# Patient Record
Sex: Male | Born: 1960 | Race: Black or African American | Hispanic: No | Marital: Single | Smoking: Current some day smoker
Health system: Southern US, Community
[De-identification: ages and names within clinical notes are randomized; demographics above are authoritative.]

## PROBLEM LIST (undated history)

## (undated) DIAGNOSIS — B20 Human immunodeficiency virus [HIV] disease: Secondary | ICD-10-CM

## (undated) DIAGNOSIS — A549 Gonococcal infection, unspecified: Secondary | ICD-10-CM

## (undated) DIAGNOSIS — Z21 Asymptomatic human immunodeficiency virus [HIV] infection status: Secondary | ICD-10-CM

## (undated) HISTORY — PX: CHOLECYSTECTOMY: SHX55

---

## 2002-07-15 ENCOUNTER — Inpatient Hospital Stay (HOSPITAL_COMMUNITY): Admission: EM | Admit: 2002-07-15 | Discharge: 2002-07-17 | Payer: Self-pay | Admitting: Emergency Medicine

## 2002-07-15 ENCOUNTER — Encounter: Payer: Self-pay | Admitting: Internal Medicine

## 2002-08-21 ENCOUNTER — Encounter: Admission: RE | Admit: 2002-08-21 | Discharge: 2002-08-21 | Payer: Self-pay | Admitting: Internal Medicine

## 2002-08-28 ENCOUNTER — Encounter: Admission: RE | Admit: 2002-08-28 | Discharge: 2002-08-28 | Payer: Self-pay | Admitting: Internal Medicine

## 2002-10-23 ENCOUNTER — Encounter: Admission: RE | Admit: 2002-10-23 | Discharge: 2002-10-23 | Payer: Self-pay | Admitting: Internal Medicine

## 2002-10-31 ENCOUNTER — Encounter: Admission: RE | Admit: 2002-10-31 | Discharge: 2002-10-31 | Payer: Self-pay | Admitting: Internal Medicine

## 2002-11-07 ENCOUNTER — Encounter: Admission: RE | Admit: 2002-11-07 | Discharge: 2002-11-07 | Payer: Self-pay | Admitting: Internal Medicine

## 2002-11-17 ENCOUNTER — Encounter: Admission: RE | Admit: 2002-11-17 | Discharge: 2002-11-17 | Payer: Self-pay | Admitting: Internal Medicine

## 2003-08-04 ENCOUNTER — Emergency Department (HOSPITAL_COMMUNITY): Admission: EM | Admit: 2003-08-04 | Discharge: 2003-08-04 | Payer: Self-pay | Admitting: Emergency Medicine

## 2003-08-08 ENCOUNTER — Encounter: Admission: RE | Admit: 2003-08-08 | Discharge: 2003-08-08 | Payer: Self-pay | Admitting: Internal Medicine

## 2003-08-10 ENCOUNTER — Emergency Department (HOSPITAL_COMMUNITY): Admission: EM | Admit: 2003-08-10 | Discharge: 2003-08-10 | Payer: Self-pay

## 2003-08-15 ENCOUNTER — Encounter: Admission: RE | Admit: 2003-08-15 | Discharge: 2003-08-15 | Payer: Self-pay | Admitting: Internal Medicine

## 2003-09-12 ENCOUNTER — Encounter: Admission: RE | Admit: 2003-09-12 | Discharge: 2003-09-12 | Payer: Self-pay | Admitting: Internal Medicine

## 2003-09-21 ENCOUNTER — Ambulatory Visit (HOSPITAL_COMMUNITY): Admission: RE | Admit: 2003-09-21 | Discharge: 2003-09-21 | Payer: Self-pay | Admitting: Gastroenterology

## 2003-10-19 ENCOUNTER — Encounter: Admission: RE | Admit: 2003-10-19 | Discharge: 2003-10-19 | Payer: Self-pay | Admitting: Gastroenterology

## 2003-12-17 ENCOUNTER — Encounter (INDEPENDENT_AMBULATORY_CARE_PROVIDER_SITE_OTHER): Payer: Self-pay | Admitting: *Deleted

## 2003-12-17 ENCOUNTER — Ambulatory Visit (HOSPITAL_COMMUNITY): Admission: RE | Admit: 2003-12-17 | Discharge: 2003-12-18 | Payer: Self-pay | Admitting: Surgery

## 2008-05-03 ENCOUNTER — Emergency Department (HOSPITAL_COMMUNITY): Admission: EM | Admit: 2008-05-03 | Discharge: 2008-05-03 | Payer: Self-pay | Admitting: Emergency Medicine

## 2008-08-15 ENCOUNTER — Emergency Department (HOSPITAL_COMMUNITY): Admission: EM | Admit: 2008-08-15 | Discharge: 2008-08-15 | Payer: Self-pay | Admitting: Emergency Medicine

## 2008-10-17 ENCOUNTER — Emergency Department (HOSPITAL_COMMUNITY): Admission: EM | Admit: 2008-10-17 | Discharge: 2008-10-17 | Payer: Self-pay | Admitting: Emergency Medicine

## 2008-10-18 ENCOUNTER — Emergency Department (HOSPITAL_COMMUNITY): Admission: EM | Admit: 2008-10-18 | Discharge: 2008-10-18 | Payer: Self-pay | Admitting: Emergency Medicine

## 2008-10-23 ENCOUNTER — Inpatient Hospital Stay (HOSPITAL_COMMUNITY): Admission: EM | Admit: 2008-10-23 | Discharge: 2008-10-26 | Payer: Self-pay | Admitting: Gastroenterology

## 2008-11-20 ENCOUNTER — Emergency Department (HOSPITAL_COMMUNITY): Admission: EM | Admit: 2008-11-20 | Discharge: 2008-11-20 | Payer: Self-pay | Admitting: Family Medicine

## 2010-01-19 IMAGING — CT CT PELVIS W/ CM
2 of 5 series · 17 of 46 positions shown, 19 images · IV contrast (agent unspecified)
Comparison: No priors

CT ABDOMEN

CLINICAL DATA: Lower abdominal pain

CT ABDOMEN AND PELVIS WITH CONTRAST
TECHNIQUE: Multidetector CT imaging of the abdomen and pelvis was
performed using the standard protocol following bolus
administration of intravenous contrast.
Contrast: 100 ml Rmnipaque-VPP

[Series 4: abd/pelv with 5.0 b31f st · axial · 0.66mm/px · z∈[-427,+13]mm · 14 of 98 slices shown, 16 images]
[im 5/98  soft-tissue]
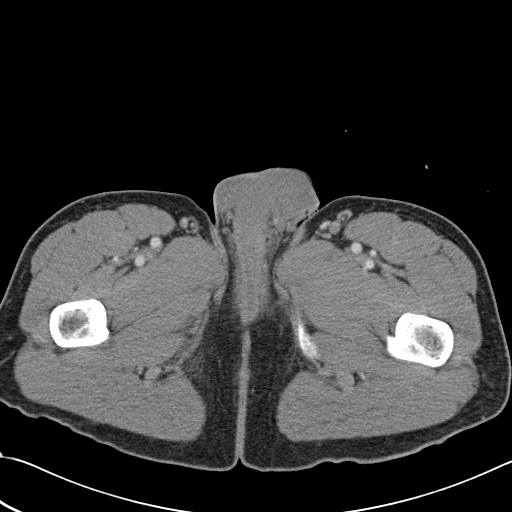
[im 5/98  bone]
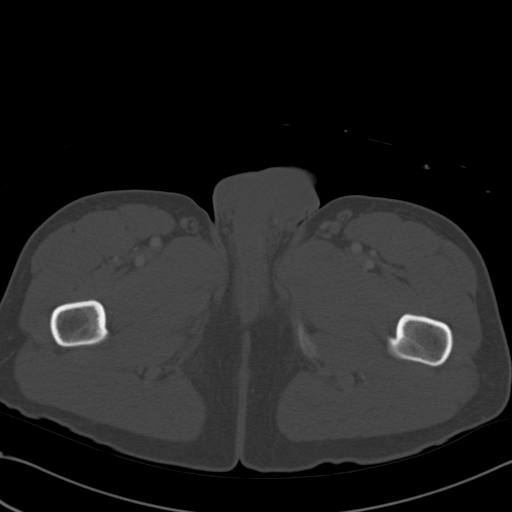
[im 15/98  soft-tissue]
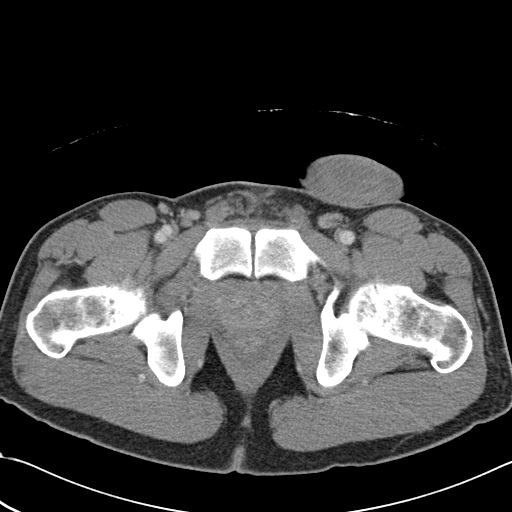
[im 20/98  soft-tissue]
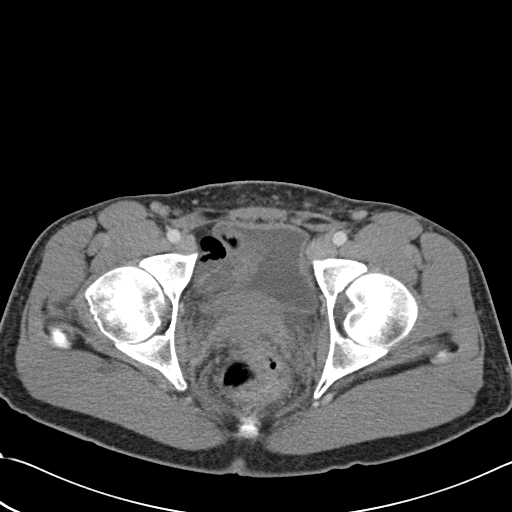
[im 25/98  soft-tissue]
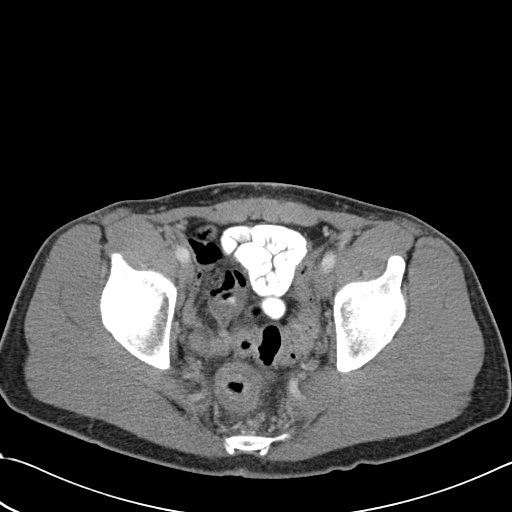
[im 34/98  soft-tissue]
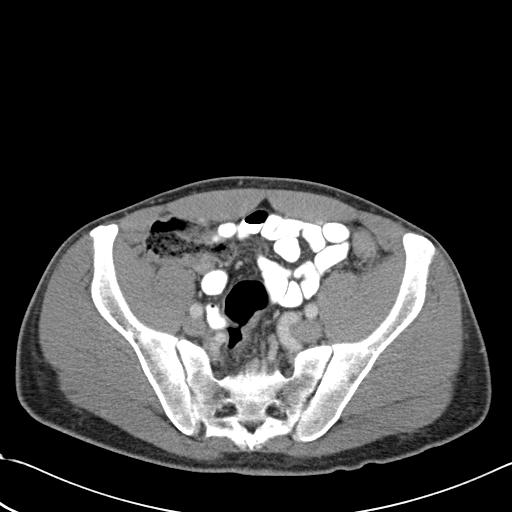
[im 39/98  soft-tissue]
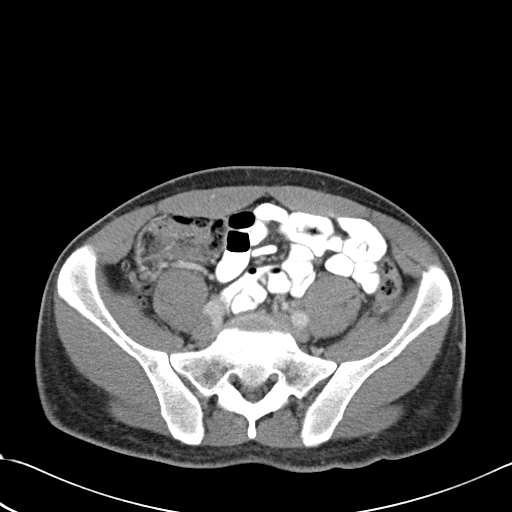
[im 44/98  soft-tissue]
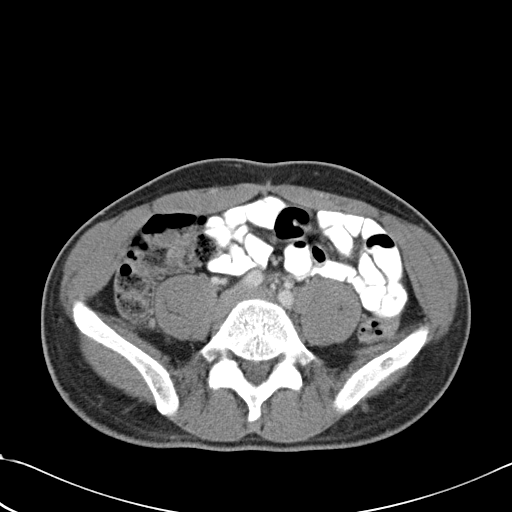
[im 54/98  soft-tissue]
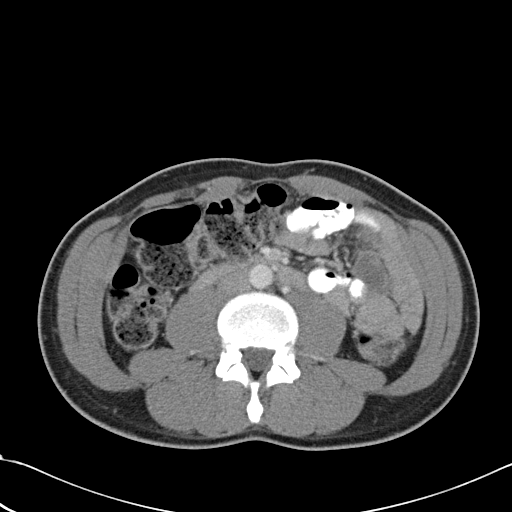
[im 59/98  soft-tissue]
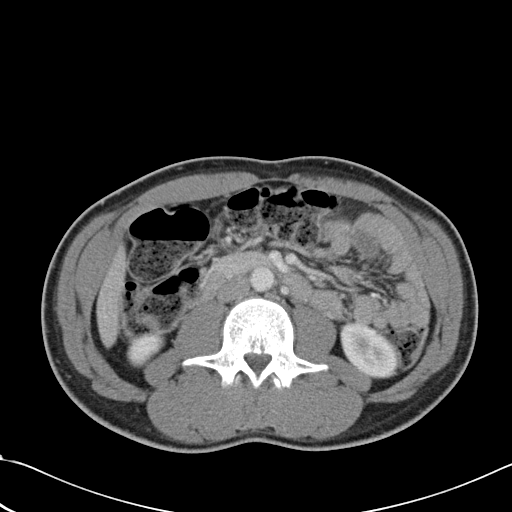
[im 59/98  bone]
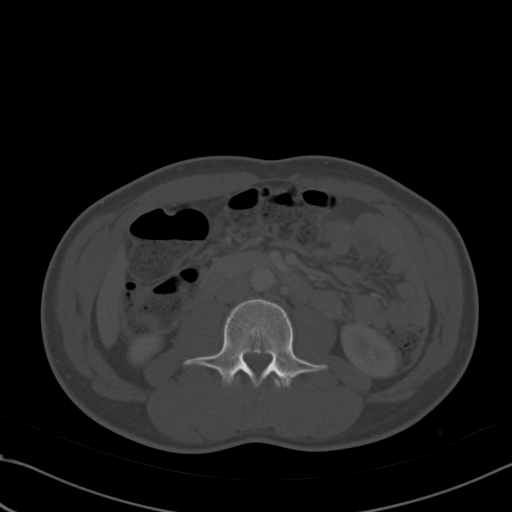
[im 64/98  soft-tissue]
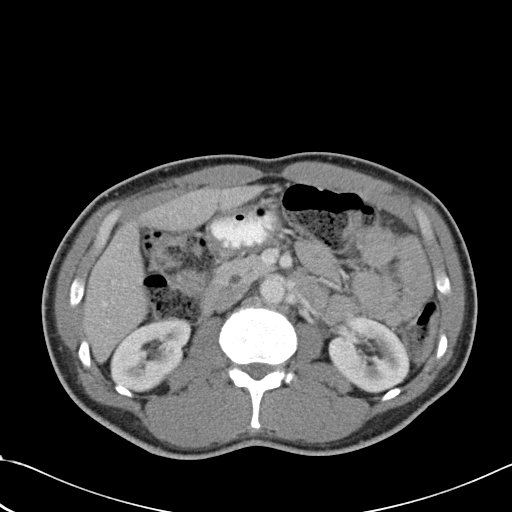
[im 73/98  soft-tissue]
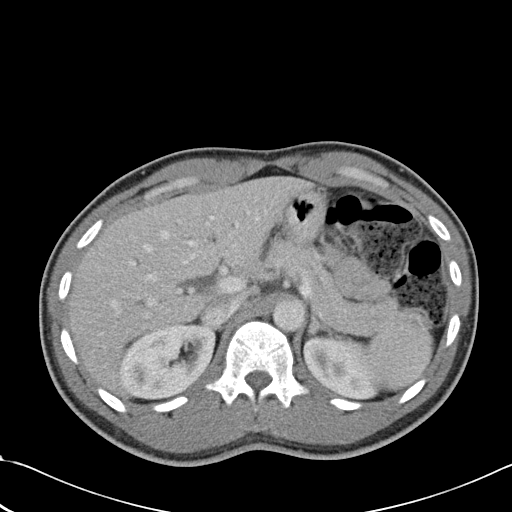
[im 78/98  soft-tissue]
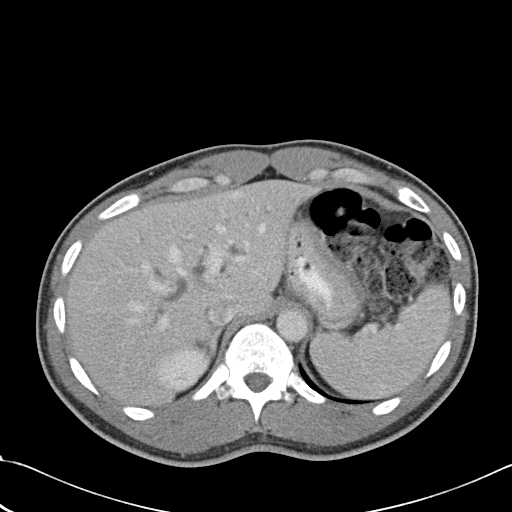
[im 83/98  soft-tissue]
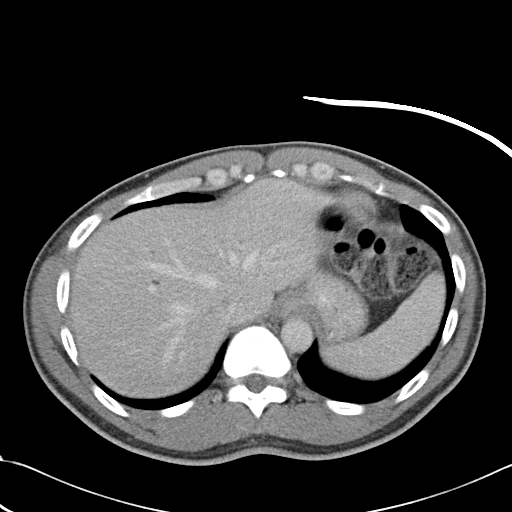
[im 93/98  soft-tissue]
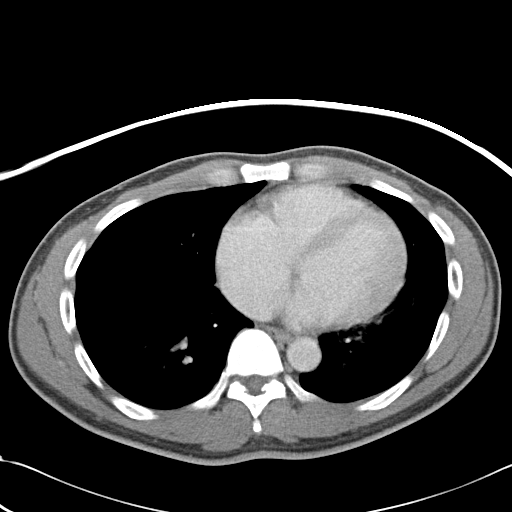

[Series 602: cor abd/pel · coronal · 0.95mm/px · 3 of 92 slices shown]
[im 31/92  soft-tissue]
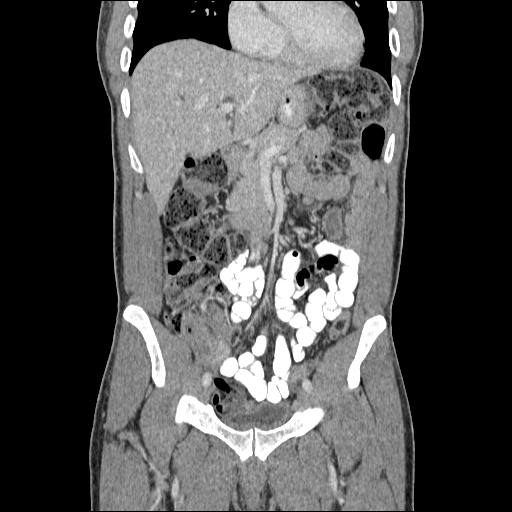
[im 41/92  soft-tissue]
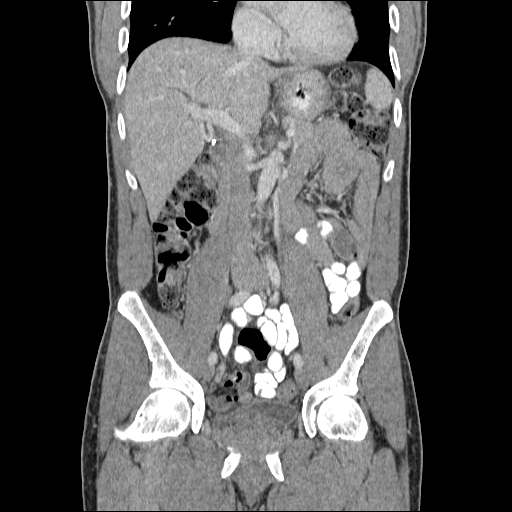
[im 51/92  soft-tissue]
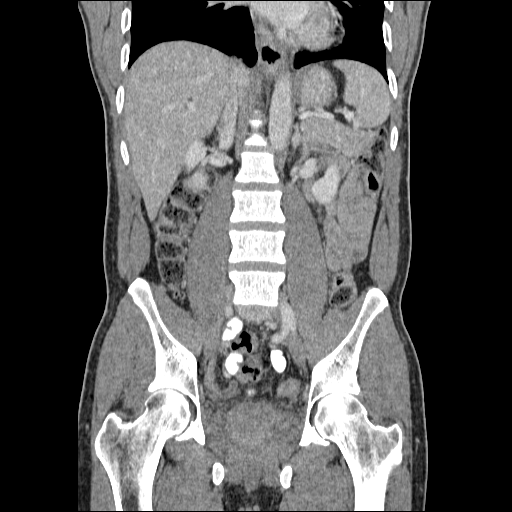

[17 of 46 positions shown; findings below may reference images not displayed]

FINDINGS: The lung bases are clear.  Liver, spleen, pancreas, and
adrenals unremarkable.  Early and delayed images of the kidneys
normal except for a small simple cyst of the right kidney.

There is moderate intra and extrahepatic biliary dilatation.  This
can be seen normally post cholecystectomy, although the
intrahepatic ducts are generally not as dilated as noted on this
exam.  Cannot exclude biliary obstruction.  This needs clinical
correlation.

No adenopathy or ascites.  Generalized increased stool throughout
the colon without inflammatory changes of the bowel.
IMPRESSION: 1.  Normal except for intra and extrahepatic biliary dilatation -
this may be due to prior cholecystectomy, but needs clinical
correlation.  See report.

CT PELVIS
FINDINGS: No focal masses, adenopathy, or fluid collections.  The
appendix is normal.  No changes of diverticulitis or other focal
abnormality of the GI tract.  There is a moderate increase in stool
noted.
IMPRESSION: 1.  No acute or specific findings.
2.  Relative increased stool throughout the colon.

## 2010-05-11 ENCOUNTER — Emergency Department (HOSPITAL_COMMUNITY): Admission: EM | Admit: 2010-05-11 | Discharge: 2010-05-11 | Payer: Self-pay | Admitting: Emergency Medicine

## 2010-12-27 ENCOUNTER — Encounter: Payer: Self-pay | Admitting: Gastroenterology

## 2011-04-21 NOTE — Discharge Summary (Signed)
[name], Trevor Collins NO.:  [id number]   MEDICAL RECORD NO.:  [id number]          PATIENT TYPE:  INP   LOCATION:  3029                         FACILITY:  [facility]   PHYSICIAN:  Jordan Hawks. Elnoria Howard, MD    DATE OF BIRTH:  [birth year]   DATE OF ADMISSION:  10/23/2008  DATE OF DISCHARGE:  10/26/2008                               DISCHARGE SUMMARY   DISCHARGE DIAGNOSIS:  Acute idiopathic constipation.   HOSPITAL COURSE:  Please see the original H and P for full details.  The  patient was admitted with acute constipation.  He apparently had been  suffering with this constipation for approximately 2 weeks and despite  over-the-counter medications, he did not receive any benefit.  The  patient was evaluated in the office and instructed to take 1 bottle of  MiraLax in order to cause a significant cathartic effect and  unfortunately, the patient did not experience any type of effect and he  felt that his abdomen was tight and there is abdominal pain and an  associated nausea.  Because of his symptoms, there was concern for the  possibility of obstruction.  He was subsequently admitted to the  hospital for further evaluation and treatment.  A KUB was performed and  was negative for any evidence of an obstruction.  He was provided with  multiple Fleet enemas and tap water enemas, however, this did not  provide any significant effect.  On the day before he was discharged, he  was started on GoLYTELY 4 L as well as bisacodyl 20 mg and erythromycin  250 mg q.8 h.  Subsequently, the patient was able to be cleared out and  he felt back to his baseline before the acute onset of constipation.  Because the patient was back to his baseline, it was felt that he could  be safely discharged.   Plan at this time is for the patient:  1. To maintain on MiraLax 3 capfuls per day.  2. To maintain Amitiza 24 mcg b.i.d.  3. To follow up with a colonoscopy on October 30, 2008, as previously  scheduled to evaluate a heme-positive stool and hematochezia, and      he can call Dr. Elnoria Howard with any issues or questions.      Jordan Hawks Elnoria Howard, MD  Electronically Signed     PDH/MEDQ  D:  10/26/2008  T:  10/26/2008  Job:  [id number]

## 2011-04-21 NOTE — H&P (Signed)
NAMEMAVERYCK, Trevor Collins NO.:  [id number]   MEDICAL RECORD NO.:  [id number]          PATIENT TYPE:  INP   LOCATION:  3029                         FACILITY:  [facility]   PHYSICIAN:  Jordan Hawks. Elnoria Howard, MD    DATE OF BIRTH:  05/20/1961   DATE OF ADMISSION:  10/23/2008  DATE OF DISCHARGE:                              HISTORY & PHYSICAL   REASON FOR ADMISSION:  Constipation and abdominal pain.   HISTORY OF PRESENT ILLNESS:  This is a 50 year old gentleman who has a  past medical history of biliary dyskinesia, status post cholecystectomy  in 2005, who was originally seen in the office 24 hours ago with  complaints of constipation and the patient was evaluated in the urgent  care setting and also at [facility] Emergency Room for acute onset of  constipation.  The patient previously had routine bowel movements and he  typically had bowel movements after p.o. intake.  However, for unclear  causes, the patient has developed acute constipation.  In the emergency  room, a CT scan was performed and it was negative for any evidence of an  obstruction or any masses, although with rectal examination in the  office, the patient was noted to be heme positive.  The patient tried  over-the-counter laxatives such as magnesium citrate as well as Fleet  Enema without any significant efficacy.  He currently does report having  some flatus, but no bowel movements.  Since his office visit, he was  recommended to start on MiraLax 255 g to be mixed with 32 ounces of  water.  He did this treatment as instructed and unfortunately he has had  no bowel movements as a result.  The patient called today, complaining  about having no results and requested further options.  A Fleet Enema x2  was recommended and unfortunately this did not provide any benefits and  subsequently, he was provided with samples of Amitiza 24 mcg.  This also  did not provide any benefit and subsequently, it was felt that he should  be admitted to the hospital for further treatment because of his  abdominal discomfort and nausea.   PAST MEDICAL HISTORY AND PAST SURGICAL HISTORY:  As stated above, with  the addition of a prior infection of hepatitis B.   FAMILY HISTORY:  Noncontributory.   SOCIAL HISTORY:  Negative for illicit drug use and alcohol.  Positive  for tobacco 1/2 pack per day.   REVIEW OF SYSTEMS:  As stated above in the history of present illness,  otherwise negative.   PHYSICAL EXAMINATION:  VITAL SIGNS:  Pending at this time.  GENERAL:  The patient is in no acute distress, but he does appear to be  very uncomfortable.  HEENT:  Normocephalic and atraumatic.  Extraocular muscles are intact.  NECK:  Supple.  No lymphadenopathy.  LUNGS:  Clear to auscultation bilaterally.  CARDIOVASCULAR:  Regular rate and rhythm.  ABDOMEN:  Flat and soft.  Positive bowel sounds.  No significant  tenderness.  No rebound or rigidity.  EXTREMITIES:  No clubbing, cyanosis, or edema.   LABORATORY DATA:  Laboratory values are pending at this time.   IMPRESSION:  1. Acute constipation of unknown etiology.  2. Abdominal discomfort secondary to the constipation.  At this time,      I am uncertain why the patient has such significant constipation.      I did review the CT scan and his prior KUB and it was essentially      unrevealing for any etiology.  There was stool, but it did not      appear to be more than anticipated.  The original plan was for him      to undergo a colonoscopy to further evaluate this heme positive      stool and in anticipation that he was to respond to MiraLax.      Unfortunately, there was no current response at this time.   PLAN:  1. Admit the patient.  2. Provide gentle tap water enemas in hopes of obtaining a cathodic      effect.  3. Order acute abdominal series to ensure that there is no evidence of      any obstruction.      Jordan Hawks Elnoria Howard, MD  Electronically Signed      PDH/MEDQ  D:  10/23/2008  T:  10/24/2008  Job:  [id number]

## 2011-04-24 NOTE — Op Note (Signed)
   NAME:  [name], Trevor Collins                         ACCOUNT NO.:  [id number]   MEDICAL RECORD NO.:  [id number]                   PATIENT TYPE:  INP   LOCATION:  3308                                 FACILITY:  [facility]   PHYSICIAN:  Charolett Bumpers, M.D.             DATE OF BIRTH:  05-26-1961   DATE OF PROCEDURE:  07/16/2002  DATE OF DISCHARGE:  07/17/2002                                 OPERATIVE REPORT   PROCEDURE PERFORMED:  Esophagogastroduodenoscopy.   ENDOSCOPIST:  Charolett Bumpers, M.D.   INDICATIONS FOR PROCEDURE:  [name] is a 50 year old male born 1961/04/27.  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   PREMEDICATION:  Versed 7.5 mg, fentanyl 50 mcg.   INSTRUMENT USED:  Olympus gastroscope.   DESCRIPTION OF PROCEDURE:  After obtaining informed consent, the patient was  placed in left lateral decubitus position.  I administered intravenous  Versed and intravenous fentanyl to achieve conscious sedation for this  procedure.  The patient's blood pressure, oxygen saturations and cardiac  rhythm were monitored throughout the procedure and documented in the medical  record.   The Olympus gastroscope was passed through the posterior hypopharynx into  the proximal esophagus without difficulty.  The hypopharynx, larynx and  vocal cords appeared normal.   Esophagoscopy:  The proximal, mid and lower segments of the esophagus appear  normal.  Gastroscopy:  Retroflex view of the gastric cardia and fundus was normal.  The gastric body, antrum and pylorus appeared normal.  Duodenoscopy:  The duodenal bulb, second portion of the duodenum and third  portion of the duodenum appeared normal.   ASSESSMENT:  Normal esophagogastroduodenoscopy.  No signs of upper  gastrointestinal bleeding.                                               Charolett Bumpers, M.D.    MKJ/MEDQ  D:  07/16/2002  T:  07/18/2002  Job:   [id number]   cc:   Alvester Morin, M.D.

## 2011-04-24 NOTE — Op Note (Signed)
   NAME:  Trevor Collins, Trevor Collins                         ACCOUNT NO.:  [id number]   MEDICAL RECORD NO.:  [id number]                   PATIENT TYPE:  AMB   LOCATION:  ENDO                                 FACILITY:  [facility]   PHYSICIAN:  Anselmo Rod, M.D.               DATE OF BIRTH:  09/02/1961   DATE OF PROCEDURE:  09/21/2003  DATE OF DISCHARGE:                                 OPERATIVE REPORT   PROCEDURE PERFORMED:  Esophagogastroduodenoscopy.   ENDOSCOPIST:  Anselmo Rod, M.D.   INSTRUMENT USED:  Olympus video panendoscope.   INDICATION FOR PROCEDURE:  Epigastric pain, chest discomfort, with rectal  bleeding in a 50 year old African-American male.  Rule out peptic ulcer  disease, esophagitis, gastritis, etc.   PREPROCEDURE PREPARATION:  Informed consent was procured from the patient.  The patient was fasted for eight hours prior to the procedure.   PREPROCEDURE PHYSICAL:  VITAL SIGNS:  The patient had stable vital signs.  NECK:  Supple.  CHEST:  Clear to auscultation.  S1, S2 regular.  ABDOMEN:  Soft with normal bowel sounds.   DESCRIPTION OF PROCEDURE:  The patient was placed in the left lateral  decubitus position and sedated with 50 mg of Demerol and 5 mg of Versed  intravenously.  Once the patient was adequately sedate and maintained on low-  flow oxygen and continuous cardiac monitoring, the Olympus video  panendoscope was advanced through the mouthpiece, over the tongue, into the  esophagus under direct vision.  The entire esophagus appeared normal with no  evidence of ring, stricture, masses, esophagitis, or Barrett's mucosa.  The  scope was then advanced into the stomach.  The entire gastric mucosa and the  proximal small bowel appeared normal.   IMPRESSION:  Normal esophagogastroduodenoscopy.    RECOMMENDATIONS:  1. Continue PPIs.  2. Avoid nonsteroidals, including aspirin.  3. Outpatient follow-up in the next two weeks for further recommendations.                                       Anselmo Rod, M.D.    JNM/MEDQ  D:  09/21/2003  T:  09/22/2003  Job:  [id number]   cc:   Dineen Kid. Reche Dixon, M.D.  [address]  [place] [address]  Fax: [phone]

## 2011-04-24 NOTE — Op Note (Signed)
NAME:  RAJVEER, HANDLER                         ACCOUNT NO.:  [id number]   MEDICAL RECORD NO.:  [id number]                   PATIENT TYPE:  OIB   LOCATION:  2899                                 FACILITY:  [facility]   PHYSICIAN:  Abigail Miyamoto, M.D.              DATE OF BIRTH:  04/25/1961   DATE OF PROCEDURE:  12/17/2003  DATE OF DISCHARGE:                                 OPERATIVE REPORT   PREOPERATIVE DIAGNOSIS:  Symptomatic cholelithiasis and biliary dyskinesia.   POSTOPERATIVE DIAGNOSIS:  Symptomatic cholelithiasis and biliary dyskinesia.   PROCEDURE:  Laparoscopic cholecystectomy with intraoperative cholangiogram.   SURGEON:  Abigail Miyamoto, M.D.   ANESTHESIA:  General endotracheal anesthesia with 0.25% Marcaine.   ESTIMATED BLOOD LOSS:  Minimal.   INDICATIONS FOR PROCEDURE:  The patient is a 50 year old gentleman with  hepatitis B who presented with abdominal pain.  He has had an ultrasound  showing gallbladder gallstones, sludge, and polyps.  He has had a follow-up  ultrasound which showed no stones.  He has had a Hiatus scan showing 53%  ejection fraction.  Given all of these findings, the decision was made to  proceed to the operating room for laparoscopic cholecystectomy and possible  liver biopsy.   FINDINGS:  The patient was found to have a normal liver, therefore biopsy  was deferred.  Cholangiogram was normal.   DESCRIPTION OF PROCEDURE:  The patient was brought to the operating room and  identified as Lilli Light.  He was placed supine on the operating table  and general anesthesia was induced.  His abdomen was then prepped and draped  in the usual sterile fashion.  Using a #15 blade, a small transverse  incision was made below the umbilicus.  The incision was then carried down  to the fascia which was then opened with the scalpel.  Hemostat was used to  pass through the peritoneal cavity.  Next, a 0 Vicryl pursestring suture was  placed around the fascial  opening.  The Hasson port was placed through the  opening and insufflation of the abdomen was begun.  A 12 mm port was then  placed in the patient's epigastrium.  Two 5 mm ports were placed in the  patient's right flank under direct vision.  The gallbladder was then grasped  and retracted above the liver bed.  Dissection was then carried out at the  base of the gallbladder.  The patient was found to have multiple adhesions  to the gallbladder which were taken down with the cautery.  The cystic duct  was then dissected out and clipped once distally.  It was then partly opened  with the laparoscopic scissors.  An Angiocatheter was then inserted in the  right upper quadrant under direct vision.  The Cholangiocatheter was passed  through the Angiocatheter and placed into the opening in the cystic duct.  The cholangiogram was then performed under direct fluoroscopy.  Good flow of  contrast was seen into the entire biliary system and duodenum without  evidence of abnormality.  At this point, the Cholangiocatheter was removed.  The cystic duct was then clipped three times proximally and transected with  scissors.  The cystic artery was then identified, clipped twice proximally  and once distally, and transected as well.  A second posterior vessel was  clipped once proximally and transected with the electrocautery.  The  gallbladder was then slowly dissected free from the liver bed with the  electrocautery.  Once it was free from the liver bed, the liver bed was  examined and hemostasis was achieved with the cautery.  The gallbladder was  then removed through the incision at the umbilicus and sent to pathology for  identification.  The 0 Vicryl at the umbilicus was then tied in place  closing the fascial defect.  The liver bed was again examined and hemostasis  felt to be achieved.  The abdomen was then irrigated with normal saline.  All ports were then removed under direct vision and the abdomen  was  deflated.  All incisions were anesthetized with 0.25% Marcaine and then  closed with 4-0 Monocryl subcuticular sutures.  Steri-Strips, gauze, and  tape were then applied.  The patient tolerated the procedure well.  All  needle, sponge, and instrument counts correct at the end of the procedure.  The patient was then extubated in the operating room and taken in stable  condition to the recovery room.                                               Abigail Miyamoto, M.D.    DB/MEDQ  D:  12/17/2003  T:  12/17/2003  Job:  [id number]

## 2011-05-22 ENCOUNTER — Emergency Department (HOSPITAL_COMMUNITY)
Admission: EM | Admit: 2011-05-22 | Discharge: 2011-05-22 | Disposition: A | Discharge status: Home / Self Care | Payer: 59 | Attending: Emergency Medicine | Admitting: Emergency Medicine

## 2011-05-22 DIAGNOSIS — L0211 Cutaneous abscess of neck: Secondary | ICD-10-CM | POA: Insufficient documentation

## 2011-05-22 DIAGNOSIS — L03221 Cellulitis of neck: Secondary | ICD-10-CM | POA: Insufficient documentation

## 2011-09-02 LAB — URINE CULTURE: Colony Count: 2000

## 2011-09-02 LAB — COMPREHENSIVE METABOLIC PANEL
ALT: 14
AST: 18
Albumin: 3.8
Alkaline Phosphatase: 71
BUN: 5 — ABNORMAL LOW
CO2: 27
Calcium: 8.6
Chloride: 106
Creatinine, Ser: 1.1
GFR calc Af Amer: >60
GFR calc non Af Amer: >60
Glucose, Bld: 96
Potassium: 3.6
Sodium: 140
Total Bilirubin: 0.7
Total Protein: 6.6

## 2011-09-02 LAB — URINALYSIS, ROUTINE W REFLEX MICROSCOPIC
Bilirubin Urine: NEGATIVE
Glucose, UA: NEGATIVE
Hgb urine dipstick: NEGATIVE
Ketones, ur: NEGATIVE
Nitrite: NEGATIVE
Protein, ur: NEGATIVE
Specific Gravity, Urine: 1.017
Urobilinogen, UA: 0.2
pH: 7

## 2011-09-02 LAB — CBC
HCT: 45.8
Hemoglobin: 15.4
MCHC: 33.7
MCV: 81.1
Platelets: 209
RBC: 5.65
RDW: 13.9
WBC: 6.1

## 2011-09-02 LAB — DIFFERENTIAL
Basophils Absolute: 0
Basophils Relative: 0
Eosinophils Absolute: 0.2
Eosinophils Relative: 3
Lymphocytes Relative: 18
Lymphs Abs: 1.1
Monocytes Absolute: 0.2
Monocytes Relative: 3
Neutro Abs: 4.6
Neutrophils Relative %: 75

## 2011-09-02 LAB — URINE MICROSCOPIC-ADD ON

## 2011-09-02 LAB — LIPASE, BLOOD: Lipase: 15

## 2011-09-02 LAB — OCCULT BLOOD X 1 CARD TO LAB, STOOL: Fecal Occult Bld: NEGATIVE

## 2011-09-08 LAB — COMPREHENSIVE METABOLIC PANEL
ALT: 16
AST: 35
Albumin: 3.7
Alkaline Phosphatase: 111
BUN: 5 — ABNORMAL LOW
CO2: 28
Calcium: 8.8
Chloride: 105
Creatinine, Ser: 1.01
GFR calc Af Amer: >60
GFR calc non Af Amer: >60
Glucose, Bld: 86
Potassium: 4
Sodium: 140
Total Bilirubin: 1.1
Total Protein: 6.6

## 2011-09-08 LAB — DIFFERENTIAL
Basophils Absolute: 0
Basophils Relative: 1
Eosinophils Absolute: 0.2
Eosinophils Relative: 3
Lymphocytes Relative: 34
Lymphs Abs: 1.8
Monocytes Absolute: 0.4
Monocytes Relative: 8
Neutro Abs: 2.9
Neutrophils Relative %: 54

## 2011-09-08 LAB — CBC
HCT: 43.7
HCT: 43.8
HCT: 48.5
Hemoglobin: 14.3
Hemoglobin: 14.3
Hemoglobin: 15.7
MCHC: 32.4
MCHC: 32.7
MCHC: 32.8
MCV: 81.4
MCV: 81.8
MCV: 82.2
Platelets: 216
Platelets: 239
RBC: 5.32
RBC: 5.34
RBC: 5.96 — ABNORMAL HIGH
RDW: 14.6
RDW: 14.8
RDW: 14.9
WBC: 4.4
WBC: 5.3

## 2011-09-08 LAB — OCCULT BLOOD X 1 CARD TO LAB, STOOL: Fecal Occult Bld: POSITIVE

## 2011-09-08 LAB — BASIC METABOLIC PANEL
BUN: 2 — ABNORMAL LOW
CO2: 24
CO2: 25
Calcium: 8.9
Chloride: 109
Chloride: 110
Creatinine, Ser: 0.93
GFR calc Af Amer: >60
GFR calc Af Amer: >60
GFR calc non Af Amer: >60
Glucose, Bld: 86
Glucose, Bld: 86
Potassium: 3.8
Potassium: 4.1
Sodium: 139
Sodium: 140

## 2011-09-08 LAB — LIPASE, BLOOD: Lipase: 14

## 2011-09-09 LAB — GC/CHLAMYDIA PROBE AMP, GENITAL: GC Probe Amp, Genital: POSITIVE — AB

## 2011-09-09 LAB — POCT URINALYSIS DIP (DEVICE)
Hgb urine dipstick: NEGATIVE
Ketones, ur: NEGATIVE
Protein, ur: NEGATIVE
Specific Gravity, Urine: 1.01
Urobilinogen, UA: 1

## 2011-09-10 LAB — HIV ANTIBODY (ROUTINE TESTING W REFLEX): HIV: REACTIVE — AB

## 2011-09-10 LAB — HIV 1/2 CONFIRMATION: HIV-1 antibody: POSITIVE

## 2011-09-10 LAB — GC/CHLAMYDIA PROBE AMP, GENITAL: Chlamydia, DNA Probe: NEGATIVE

## 2011-09-10 LAB — RPR: RPR Ser Ql: NONREACTIVE

## 2011-10-15 ENCOUNTER — Encounter: Payer: Self-pay | Admitting: *Deleted

## 2011-10-15 ENCOUNTER — Emergency Department (HOSPITAL_COMMUNITY)
Admission: EM | Admit: 2011-10-15 | Discharge: 2011-10-15 | Disposition: A | Discharge status: Home / Self Care | Payer: 59 | Attending: Emergency Medicine | Admitting: Emergency Medicine

## 2011-10-15 DIAGNOSIS — L03319 Cellulitis of trunk, unspecified: Secondary | ICD-10-CM | POA: Insufficient documentation

## 2011-10-15 DIAGNOSIS — L02219 Cutaneous abscess of trunk, unspecified: Secondary | ICD-10-CM | POA: Insufficient documentation

## 2011-10-15 MED ORDER — DOXYCYCLINE HYCLATE 100 MG PO CAPS: 100.0000 mg | ORAL_CAPSULE | Freq: Two times a day (BID) | ORAL | Status: AC

## 2011-10-15 NOTE — ED Notes (Signed)
Pt to ED with abscess to left lower groin area x couple days. Pt states noticed the area approx 3-4 days ago, but the area has gotten worse

## 2011-10-15 NOTE — ED Provider Notes (Signed)
Medical screening examination/treatment/procedure(s) were performed by non-physician practitioner and as supervising physician I was immediately available for consultation/collaboration.  Olivia Mackie, MD 10/15/11 0330

## 2011-10-15 NOTE — ED Provider Notes (Signed)
History     CSN: [id number] Arrival date & time: 10/15/2011 12:40 AM   First MD Initiated Contact with Patient 10/15/11 0130      Chief Complaint  Patient presents with  . Abscess   HPI:   The history is provided by the patient. No language interpreter was used.            Pt reports 2 day hx of painful, abscess to (L) mons pubis area. States had 2 abscesses but the lower abscess opened and drained.   History reviewed. No pertinent past medical history.  History reviewed. No pertinent past surgical history.  History reviewed. No pertinent family history.  History  Substance Use Topics  . Smoking status: Never Smoker   . Smokeless tobacco: Not on file  . Alcohol Use: No      Review of Systems  Constitutional: Negative.   HENT: Negative.   Eyes: Negative.   Respiratory: Negative.   Cardiovascular: Negative.   Gastrointestinal: Negative.   Genitourinary: Negative.   Musculoskeletal: Negative.   Skin: Negative.   Neurological: Negative.   Hematological: Negative.   Psychiatric/Behavioral: Negative.     Allergies  Review of patient's allergies indicates no known allergies.  Home Medications   Current Outpatient Rx  Name Route Sig Dispense Refill  . HYDROCODONE-ACETAMINOPHEN 5-500 MG PO TABS Oral Take 1 tablet by mouth every 4 (four) hours as needed. Taking for tooth pain       BP 141/90  Pulse 94  Temp(Src) 99.5 F (37.5 C) (Oral)  Resp 20  Physical Exam  Nursing note and vitals reviewed. Constitutional: He appears well-developed and well-nourished.  HENT:  Head: Normocephalic and atraumatic.  Neck: Normal range of motion. Neck supple.  Cardiovascular: Normal rate and regular rhythm.  Exam reveals no gallop and no friction rub.   No murmur heard. Pulmonary/Chest: Effort normal and breath sounds normal.  Abdominal: Soft. Bowel sounds are normal.  Genitourinary: Penis normal.  Skin: Skin is warm, dry and intact.     Psychiatric: He has a normal mood  and affect.    ED Course  Procedures (including critical care time)  Labs Reviewed - No data to display No results found.   No diagnosis found.    MDM  Exam supports antibiotics and warm compresses as there is no palpable fluctuance.        Leanne Chang, NP 10/15/11 0321

## 2011-10-15 NOTE — ED Notes (Signed)
Pt in c/o abscess to groin area x2 days

## 2011-10-15 NOTE — ED Notes (Signed)
Pt given discharge instructions and verbalizes understanding  

## 2011-10-17 ENCOUNTER — Emergency Department (INDEPENDENT_AMBULATORY_CARE_PROVIDER_SITE_OTHER)
Admission: EM | Admit: 2011-10-17 | Discharge: 2011-10-17 | Disposition: A | Discharge status: Home / Self Care | Payer: 59 | Source: Home / Self Care

## 2011-10-17 ENCOUNTER — Encounter (HOSPITAL_COMMUNITY): Payer: Self-pay | Admitting: *Deleted

## 2011-10-17 DIAGNOSIS — L03319 Cellulitis of trunk, unspecified: Secondary | ICD-10-CM

## 2011-10-17 DIAGNOSIS — L02214 Cutaneous abscess of groin: Secondary | ICD-10-CM

## 2011-10-17 DIAGNOSIS — R21 Rash and other nonspecific skin eruption: Secondary | ICD-10-CM

## 2011-10-17 HISTORY — DX: Asymptomatic human immunodeficiency virus (hiv) infection status: Z21

## 2011-10-17 HISTORY — DX: Gonococcal infection, unspecified: A54.9

## 2011-10-17 LAB — RPR TITER: RPR Titer: 1:512 {titer} — AB

## 2011-10-17 LAB — RPR: RPR Ser Ql: REACTIVE — AB

## 2011-10-17 LAB — HIV ANTIBODY (ROUTINE TESTING W REFLEX): HIV: REACTIVE — AB

## 2011-10-17 MED ORDER — LIDOCAINE HCL (PF) 1 % IJ SOLN
5.0000 mL | Freq: Once | INTRAMUSCULAR | Status: AC
  Administered 2011-10-17: 5 mL

## 2011-10-17 MED ORDER — PENICILLIN G BENZATHINE 1200000 UNIT/2ML IM SUSP
1.2000 10*6.[IU] | Freq: Once | INTRAMUSCULAR | Status: AC
  Administered 2011-10-17: 1.2 10*6.[IU] via INTRAMUSCULAR

## 2011-10-17 MED ORDER — LIDOCAINE HCL (PF) 1 % IJ SOLN
INTRAMUSCULAR | Status: AC
  Filled 2011-10-17: qty 5

## 2011-10-17 MED ORDER — DOXYCYCLINE HYCLATE 100 MG PO CAPS: 100.0000 mg | ORAL_CAPSULE | Freq: Two times a day (BID) | ORAL | Status: AC

## 2011-10-17 MED ORDER — IBUPROFEN 800 MG PO TABS: 800.0000 mg | ORAL_TABLET | Freq: Three times a day (TID) | ORAL | Status: AC | PRN

## 2011-10-17 MED ORDER — PENICILLIN G BENZATHINE 1200000 UNIT/2ML IM SUSP
INTRAMUSCULAR | Status: AC
  Filled 2011-10-17: qty 2

## 2011-10-17 NOTE — ED Notes (Signed)
Was seen at [facility] ED 11/8 for left groin abscess - no I&D done; has been taking doxycycline as directed; reports no improvement.  Reports some drainage from abscess.  Unsure if fevers.  Started w/ rash this morning - generalized, non-pruritic, discolored spots - was told to hold morning dose of abx and get evaluated.  Has been taking hydrocodone for pain.

## 2011-10-17 NOTE — ED Notes (Signed)
MD assisted w/ I&D left groin abscess.  Pt tolerated well.

## 2011-10-17 NOTE — ED Provider Notes (Addendum)
History     CSN: [id number] Arrival date & time: 10/17/2011  9:42 AM   First MD Initiated Contact with Patient 10/17/11 1003      Chief Complaint  Patient presents with  . Rash    Left Groin Abscess    Patient is a 50 y.o. male presenting with rash. The history is provided by the patient. No language interpreter was used.  Rash  The current episode started more than 2 days ago. The problem has been gradually worsening. There has been no fever. The rash is present on the groin.   Pt with painful left sided inguinal swelling x 5 days. Seen at [facility] ED 2 days go started on doxy for presumed abscess. Has been applying warm compresses, taking doxy, Vicodin w/o relief. States mass getting larger, more painful, started draining yesterday. Also with flat nonitchy rash on extremities, genitals, torso, palms/soles. No ST, other LN. Pt sexually active with male partner who is asxatic last contact 1 month ago. No h/o GC, chlamydia, herpes, syphilis, HIV.  History reviewed. No pertinent past medical history.  Past Surgical History  Procedure Date  . Cholecystectomy     History reviewed. No pertinent family history.  History  Substance Use Topics  . Smoking status: Former Games developer  . Smokeless tobacco: Not on file  . Alcohol Use: No      Review of Systems  Constitutional: Negative for fever.  HENT: Negative for sore throat and mouth sores.   Eyes: Negative for discharge and redness.  Respiratory: Negative for cough.   Gastrointestinal: Negative for nausea, vomiting and abdominal pain.  Genitourinary: Positive for genital sores. Negative for dysuria, urgency, frequency, hematuria, discharge, penile swelling, scrotal swelling, penile pain and testicular pain.  Musculoskeletal: Negative for arthralgias.  Skin: Positive for rash.  Neurological: Negative for weakness.    Allergies  Review of patient's allergies indicates no known allergies.  Home Medications   Current Outpatient Rx    Name Route Sig Dispense Refill  . DOXYCYCLINE HYCLATE 100 MG PO CAPS Oral Take 1 capsule (100 mg total) by mouth 2 (two) times daily. 20 capsule 0  . HYDROCODONE-ACETAMINOPHEN 5-500 MG PO TABS Oral Take 1 tablet by mouth every 4 (four) hours as needed. Taking for tooth pain       BP 133/92  Pulse 87  Temp(Src) 98.6 F (37 C) (Oral)  Resp 18  SpO2 100%  Physical Exam  Nursing note and vitals reviewed. Constitutional: He is oriented to person, place, and time. He appears well-developed and well-nourished.  HENT:  Head: Normocephalic and atraumatic.  Eyes: Conjunctivae and EOM are normal.  Neck: Normal range of motion.  Cardiovascular: Normal rate, regular rhythm and normal heart sounds.   Pulmonary/Chest: Effort normal. No respiratory distress.  Abdominal: Soft. Bowel sounds are normal. He exhibits no distension.  Genitourinary: Testes normal. Circumcised. No discharge found.       Painful left inguinal LN approx 3 x 4 cm with central fluctuance. (+) mild expressible drainage. No other inguinal LN  Musculoskeletal: Normal range of motion. He exhibits no edema and no tenderness.  Lymphadenopathy:    He has no cervical adenopathy.       Right: No inguinal adenopathy present.  Neurological: He is alert and oriented to person, place, and time.  Skin: Skin is warm and dry. No rash noted.       Flat nonblanchable nontender macules over arms, torso, palms/soles, and genitals.   Psychiatric: He has a normal mood and affect.  His behavior is normal.    ED Course  Procedures (including critical care time)  INCISION AND DRAINAGE Performed by: Luiz Blare Consent: Verbal consent obtained. Risks and benefits: risks, benefits and alternatives were discussed Time out performed prior to procedure Type: abscess  Body area: left groin     Anesthesia: local infiltration  Local anesthetic: lidocaine 1%   Anesthetic total: 5 ml  Complexity: complex Blunt dissection to break up  loculations  Drainage: purulent  Drainage amount: 5-10 cc  Packing material: none Wound cx sent Area dressed  Patient tolerance: Patient tolerated the procedure well with no immediate complications.  H&P most c/w syphilis, LGV. Sent off GC/chlamydia, HIV/ RPR. Will treat now for syphilis. Giving pcn IM. Will send home with more doxy to cover LGV. Advised pt to refrain from sexual contact until he knows lab results, symptoms resolve, and partner(s) are treated. Pt provided working phone number. Advised to call here in 5 days if pt does not hear from [place]. Pt agrees.   Labs Reviewed  RPR  GC/CHLAMYDIA PROBE AMP, GENITAL  HSV 2 ANTIBODY, IGG   No results found.   No diagnosis found.    MDM         Danella Maiers Ulisses Vondrak 10/17/11 1225  Danella Maiers Waylen Depaolo 10/17/11 1236

## 2011-10-19 LAB — HSV 2 ANTIBODY, IGG: HSV 2 Glycoprotein G Ab, IgG: 4.92 IV — ABNORMAL HIGH

## 2011-10-20 ENCOUNTER — Telehealth (HOSPITAL_COMMUNITY): Payer: Self-pay | Admitting: *Deleted

## 2011-10-20 ENCOUNTER — Encounter (HOSPITAL_COMMUNITY): Payer: Self-pay | Admitting: *Deleted

## 2011-10-20 LAB — WOUND CULTURE

## 2011-10-20 NOTE — ED Notes (Signed)
Labs shown to Dr. Chaney Malling.  She said pt. was adeq. treated with Penicillin for syphilis.  Wound culture: MRSA. Pt. adeq. treated with doxycycline.  She said pt. needs to be referred to the ID clinic for HIV after the [place] test comes back.  Old labs reviewed and showed Dr. Chaney Malling that pt. had a pos. [place] test for HIV 11/20/08.  Vassie Moselle  10/20/2011

## 2011-10-22 LAB — HIV 1/2 CONFIRMATION: HIV-2 Ab: NEGATIVE

## 2011-10-22 NOTE — ED Notes (Signed)
Wound culture reviewed positive for MRSA. Pt. adequately treated with Doxycycline.  Will give pt. Result and instructions when he comes in for his HIV result. Vassie Moselle 10/22/2011

## 2011-10-26 ENCOUNTER — Telehealth (HOSPITAL_COMMUNITY): Payer: Self-pay | Admitting: *Deleted

## 2011-10-26 NOTE — ED Notes (Addendum)
Pt. came in for his HIV result. Verified with picture ID and DOB. Pt. shown his results. Pt. informed he was in the state system as being HIV positive in 11/20/08.  Pt. States he was never notified.  I asked him to wait while I checked his records. Discussed with Glean Hess RN nurse manager. She copied his record. It shows on 12/21 Pt. was contacted by phone and told to come back to [facility] for more tests.  "Pt. stated he was out of town right now and that he would come back as soon as he came back to town." 12/11/08 RN left messages at work and home phone without response. Letter sent to pt.  01/17/09 RN discussed with [place] dept. of Health and Health and safety inspector, Division of [employer].  They are trying to get with the pt.  They are not able to contact pt. They told RN they are going to take care of contacting pt. about his status.  Pt. given this information by Glean Hess RN with my witnessing.  I told pt. I would take care of referral to ID clinic. They will call or I will call him wiht a f/u appt.  Pt. received this information well.  Vassie Moselle 10/26/2011

## 2011-10-26 NOTE — ED Notes (Signed)
1245  I called ID clinic to see if pt. was in their system.  If not I need to do a referral. If yes, when does pt. need to f/u with them?  She said she would find out and call me back. Vassie Moselle 10/26/2011

## 2011-10-27 NOTE — ED Notes (Signed)
ID clinic called and they said if I don't know how to do the referral in the system to fax pt.'s record and referral sheet as we did in the past.  Pt.'s chart faxed to [phone] attn Tomasita Morrow RN IV. Vassie Moselle 10/27/2011

## 2012-07-26 ENCOUNTER — Emergency Department (HOSPITAL_COMMUNITY): Payer: 59

## 2012-07-26 ENCOUNTER — Emergency Department (HOSPITAL_COMMUNITY)
Admission: EM | Admit: 2012-07-26 | Discharge: 2012-07-27 | Disposition: A | Discharge status: Home / Self Care | Payer: 59 | Attending: Emergency Medicine | Admitting: Emergency Medicine

## 2012-07-26 ENCOUNTER — Encounter (HOSPITAL_COMMUNITY): Payer: Self-pay | Admitting: *Deleted

## 2012-07-26 DIAGNOSIS — F17200 Nicotine dependence, unspecified, uncomplicated: Secondary | ICD-10-CM | POA: Insufficient documentation

## 2012-07-26 DIAGNOSIS — Z21 Asymptomatic human immunodeficiency virus [HIV] infection status: Secondary | ICD-10-CM | POA: Insufficient documentation

## 2012-07-26 DIAGNOSIS — J069 Acute upper respiratory infection, unspecified: Secondary | ICD-10-CM | POA: Insufficient documentation

## 2012-07-26 MED ORDER — IBUPROFEN 800 MG PO TABS
800.0000 mg | ORAL_TABLET | Freq: Once | ORAL | Status: AC
  Administered 2012-07-26: 800 mg via ORAL
  Filled 2012-07-26: qty 1

## 2012-07-26 MED ORDER — ALBUTEROL SULFATE (5 MG/ML) 0.5% IN NEBU
2.5000 mg | INHALATION_SOLUTION | Freq: Once | RESPIRATORY_TRACT | Status: AC
  Administered 2012-07-26: 2.5 mg via RESPIRATORY_TRACT
  Filled 2012-07-26: qty 0.5

## 2012-07-26 NOTE — ED Notes (Signed)
Pt c/o chest congestion/cough since Thurs; fever since Saturday

## 2012-07-27 MED ORDER — AZITHROMYCIN 250 MG PO TABS: 250.0000 mg | ORAL_TABLET | Freq: Every day | ORAL | Status: AC

## 2012-07-27 MED ORDER — ACETAMINOPHEN 500 MG PO TABS: 500.0000 mg | ORAL_TABLET | Freq: Four times a day (QID) | ORAL | Status: AC | PRN

## 2012-07-27 NOTE — ED Provider Notes (Signed)
History     CSN: [id number]  Arrival date & time 07/26/12  2055   First MD Initiated Contact with Patient 07/26/12 2326      Chief Complaint  Patient presents with  . URI    (Consider location/radiation/quality/duration/timing/severity/associated sxs/prior treatment) HPI Comments: Trevor Collins 51 y.o. male   The chief complaint is: Patient presents with:   URI   The patient has medical history significant for:   Past Medical History:   HIV positive                                                 Gonorrhea                                                   Patient HIV positive on HAART with stable CD4 counts reported to be 594 presents with fever cough, congestion, and rhinorrhea since last for more than a week. Patient denies sick contacts. Reports chills and night sweats. Denies NVD. Reports SOB.      The history is provided by the patient.    Past Medical History  Diagnosis Date  . HIV positive   . Gonorrhea     Past Surgical History  Procedure Date  . Cholecystectomy     No family history on file.  History  Substance Use Topics  . Smoking status: Current Some Day Smoker  . Smokeless tobacco: Not on file  . Alcohol Use: No      Review of Systems  Constitutional: Positive for fever, chills and diaphoresis.  HENT: Positive for congestion and rhinorrhea.   Respiratory: Positive for cough and shortness of breath.   Gastrointestinal: Negative for nausea, vomiting, abdominal pain and diarrhea.  All other systems reviewed and are negative.    Allergies  Review of patient's allergies indicates no known allergies.  Home Medications   Current Outpatient Rx  Name Route Sig Dispense Refill  . ELVITEG-COBICIS-EMTRICIT-TENOF 150-150-200-300 MG PO TABS Oral Take 2 tablets by mouth daily with breakfast.      BP 122/86  Pulse 102  Temp 100.8 F (38.2 C) (Oral)  Resp 26  Ht 5\' 11"  (1.803 m)  Wt 178 lb (80.74 kg)  BMI 24.83 kg/m2  SpO2  100%  Physical Exam  Nursing note and vitals reviewed. Constitutional: He appears well-developed and well-nourished. He appears distressed.  HENT:  Head: Normocephalic and atraumatic.  Mouth/Throat: Oropharynx is clear and moist.  Eyes: Conjunctivae and EOM are normal. No scleral icterus.  Neck: Normal range of motion. Neck supple.  Cardiovascular: Regular rhythm and normal heart sounds.   Pulmonary/Chest: He has wheezes.       Patient has inspiratory and expiratory wheezes in all lung fields that do not clear with cough.  Abdominal: Soft. Bowel sounds are normal. There is no tenderness.  Neurological: He is alert.  Skin: Skin is warm and dry.    ED Course  Procedures (including critical care time)  Labs Reviewed - No data to display Dg Chest 2 View  07/26/2012  *RADIOLOGY REPORT*  Clinical Data: 51 year old male with cough.  CHEST - 2 VIEW  Comparison: 10/24/2008.  Findings: Increased interstitial markings diffusely compared to the previous study.  Stable  lung volumes. Normal cardiac size and mediastinal contours.  Visualized tracheal air column is within normal limits.  No pneumothorax, pulmonary edema, pleural effusion or confluent pulmonary opacity.  Right upper quadrant surgical clips again noted. No acute osseous abnormality identified.  IMPRESSION: Increased interstitial markings diffusely since 2009.  This could be chronic or relate to acute viral / atypical respiratory infection.   Original Report Authenticated By: Ulla Potash III, M.D.      1. URI (upper respiratory infection)       MDM  HIV positive patient on HAART with stable CD4 counts presented with URI symptoms. Physical exam findings and CXR suspicious for possible atypical pneumonia or bronchitis. Patient given nebulizer treatment and fever reducer in ED, with improvement. Patient discharged on ABX and ibuprofen. No red flags for PCP pneumonia. Return precautions given verbally and in discharge  summary.          Pixie Casino, PA-C 07/27/12 [phone]

## 2012-07-28 ENCOUNTER — Emergency Department (HOSPITAL_COMMUNITY): Payer: 59

## 2012-07-28 ENCOUNTER — Emergency Department (HOSPITAL_COMMUNITY)
Admission: EM | Admit: 2012-07-28 | Discharge: 2012-07-28 | Disposition: A | Discharge status: Home / Self Care | Payer: 59 | Attending: Emergency Medicine | Admitting: Emergency Medicine

## 2012-07-28 ENCOUNTER — Encounter (HOSPITAL_COMMUNITY): Payer: Self-pay | Admitting: Emergency Medicine

## 2012-07-28 DIAGNOSIS — M549 Dorsalgia, unspecified: Secondary | ICD-10-CM | POA: Insufficient documentation

## 2012-07-28 DIAGNOSIS — Z21 Asymptomatic human immunodeficiency virus [HIV] infection status: Secondary | ICD-10-CM | POA: Insufficient documentation

## 2012-07-28 DIAGNOSIS — F17200 Nicotine dependence, unspecified, uncomplicated: Secondary | ICD-10-CM | POA: Insufficient documentation

## 2012-07-28 MED ORDER — IBUPROFEN 200 MG PO TABS
600.0000 mg | ORAL_TABLET | Freq: Once | ORAL | Status: AC
  Administered 2012-07-28: 600 mg via ORAL
  Filled 2012-07-28: qty 3

## 2012-07-28 MED ORDER — DIAZEPAM 2 MG PO TABS
2.0000 mg | ORAL_TABLET | Freq: Once | ORAL | Status: AC
  Administered 2012-07-28: 2 mg via ORAL
  Filled 2012-07-28: qty 1

## 2012-07-28 MED ORDER — CYCLOBENZAPRINE HCL 10 MG PO TABS: 10.0000 mg | ORAL_TABLET | Freq: Two times a day (BID) | ORAL | Status: AC | PRN

## 2012-07-28 MED ORDER — HYDROMORPHONE HCL PF 1 MG/ML IJ SOLN
1.0000 mg | Freq: Once | INTRAMUSCULAR | Status: AC
  Administered 2012-07-28: 1 mg via INTRAVENOUS
  Filled 2012-07-28: qty 1

## 2012-07-28 MED ORDER — KETOROLAC TROMETHAMINE 30 MG/ML IJ SOLN
30.0000 mg | Freq: Once | INTRAMUSCULAR | Status: AC
  Administered 2012-07-28: 30 mg via INTRAVENOUS
  Filled 2012-07-28: qty 1

## 2012-07-28 MED ORDER — HYDROCODONE-ACETAMINOPHEN 5-325 MG PO TABS
2.0000 | ORAL_TABLET | Freq: Once | ORAL | Status: AC
  Administered 2012-07-28: 2 via ORAL
  Filled 2012-07-28: qty 2

## 2012-07-28 MED ORDER — HYDROCODONE-ACETAMINOPHEN 5-325 MG PO TABS: 1.0000 | ORAL_TABLET | Freq: Four times a day (QID) | ORAL | Status: AC | PRN

## 2012-07-28 MED ORDER — IBUPROFEN 600 MG PO TABS: 600.0000 mg | ORAL_TABLET | Freq: Four times a day (QID) | ORAL | Status: AC | PRN

## 2012-07-28 NOTE — ED Provider Notes (Signed)
Medical screening examination/treatment/procedure(s) were performed by non-physician practitioner and as supervising physician I was immediately available for consultation/collaboration.   Celene Kras, MD 07/28/12 [phone]

## 2012-07-28 NOTE — ED Notes (Signed)
Pt alert, arrives from home, seen in ED last PM, dx Pneumonia, went home was coughing, believes he may have "thrown" his back out, resp even unlabored, skin pwd

## 2012-07-28 NOTE — ED Notes (Signed)
Patient transported to X-ray 

## 2012-07-28 NOTE — ED Notes (Signed)
Patient is resting comfortably. Family at the bedside. Waiting to be seen by MD

## 2012-07-28 NOTE — ED Notes (Signed)
Family at bedside. 

## 2012-07-28 NOTE — ED Notes (Signed)
Patient is resting comfortably. 

## 2012-08-08 NOTE — ED Provider Notes (Signed)
History     CSN: [id number]  Arrival date & time 07/28/12  0255   First MD Initiated Contact with Patient 07/28/12 [phone]      Chief Complaint  Patient presents with  . Back Pain    (Consider location/radiation/quality/duration/timing/severity/associated sxs/prior treatment) HPI Comments: Pt comes in with cc of back pain. Pt has HIV hx, CD 4 over 500 at last check. PT has been having some lower back pain with no specic precipitating, aggravating or relieving factors. There is no n/v/f/c No associated numbness, weakness, urinary incontinence, urinary retention, bowel incontinence, saddle anesthesia. No UTI like sx and no hx of renal stones.   Patient is a 51 y.o. male presenting with back pain. The history is provided by the patient and medical records.  Back Pain  Pertinent negatives include no chest pain, no abdominal pain and no dysuria.    Past Medical History  Diagnosis Date  . HIV positive   . Gonorrhea     Past Surgical History  Procedure Date  . Cholecystectomy     No family history on file.  History  Substance Use Topics  . Smoking status: Current Some Day Smoker  . Smokeless tobacco: Not on file  . Alcohol Use: No      Review of Systems  Constitutional: Negative for activity change and appetite change.  Respiratory: Negative for cough and shortness of breath.   Cardiovascular: Negative for chest pain.  Gastrointestinal: Negative for abdominal pain.  Genitourinary: Negative for dysuria.  Musculoskeletal: Positive for back pain.    Allergies  Review of patient's allergies indicates no known allergies.  Home Medications   Current Outpatient Rx  Name Route Sig Dispense Refill  . ELVITEG-COBICIS-EMTRICIT-TENOF 150-150-200-300 MG PO TABS Oral Take 2 tablets by mouth daily with breakfast.    . CYCLOBENZAPRINE HCL 10 MG PO TABS Oral Take 1 tablet (10 mg total) by mouth 2 (two) times daily as needed for muscle spasms. 20 tablet 0  .  HYDROCODONE-ACETAMINOPHEN 5-325 MG PO TABS Oral Take 1 tablet by mouth every 6 (six) hours as needed for pain. 20 tablet 0  . IBUPROFEN 600 MG PO TABS Oral Take 1 tablet (600 mg total) by mouth every 6 (six) hours as needed for pain. 30 tablet 0    BP 129/82  Pulse 84  Temp 98.4 F (36.9 C) (Oral)  Resp 18  Ht 5\' 11"  (1.803 m)  Wt 178 lb (80.74 kg)  BMI 24.83 kg/m2  SpO2 100%  Physical Exam  Constitutional: He is oriented to person, place, and time. He appears well-developed.  HENT:  Head: Normocephalic and atraumatic.  Eyes: Conjunctivae and EOM are normal. Pupils are equal, round, and reactive to light.  Neck: Normal range of motion. Neck supple.  Cardiovascular: Normal rate and regular rhythm.   Pulmonary/Chest: Effort normal and breath sounds normal.  Abdominal: Soft. Bowel sounds are normal. He exhibits no distension. There is no tenderness. There is no rebound and no guarding.  Neurological: He is alert and oriented to person, place, and time.  Skin: Skin is warm.    ED Course  Procedures (including critical care time)  Labs Reviewed - No data to display No results found.   1. Back pain       MDM  DDx includes: - DJD of the back - Spondylitises/ spondylosis - Sciatica - Spinal cord compression - Conus medullaris - Epidural hematoma - Epidural abscess - Lytic/pathologic fracture - Myelitis - Musculoskeletal pain  Pt with HIV comes  in with back pain. Although severe back pain dx considered in his case, the hx and exam dont indicate any red flags for spinal cord involvement.       Derwood Kaplan, MD 08/08/12 2212

## 2012-12-04 ENCOUNTER — Emergency Department (INDEPENDENT_AMBULATORY_CARE_PROVIDER_SITE_OTHER)
Admission: EM | Admit: 2012-12-04 | Discharge: 2012-12-04 | Disposition: A | Discharge status: Home / Self Care | Payer: 59 | Source: Home / Self Care | Attending: Emergency Medicine | Admitting: Emergency Medicine

## 2012-12-04 ENCOUNTER — Encounter: Payer: Self-pay | Admitting: *Deleted

## 2012-12-04 DIAGNOSIS — A5400 Gonococcal infection of lower genitourinary tract, unspecified: Secondary | ICD-10-CM

## 2012-12-04 DIAGNOSIS — A5401 Gonococcal cystitis and urethritis, unspecified: Secondary | ICD-10-CM

## 2012-12-04 DIAGNOSIS — R300 Dysuria: Secondary | ICD-10-CM

## 2012-12-04 LAB — POCT URINALYSIS DIP (MANUAL ENTRY)
Bilirubin, UA: NEGATIVE
Glucose, UA: NEGATIVE
Ketones, POC UA: NEGATIVE
Spec Grav, UA: 1.02 (ref 1.005–1.03)
pH, UA: 7 (ref 5–8)

## 2012-12-04 MED ORDER — AZITHROMYCIN 250 MG PO TABS: ORAL_TABLET | ORAL | Status: DC

## 2012-12-04 MED ORDER — CEFTRIAXONE SODIUM 250 MG IJ SOLR
250.0000 mg | Freq: Once | INTRAMUSCULAR | Status: AC
  Administered 2012-12-04: 250 mg via INTRAMUSCULAR

## 2012-12-04 NOTE — ED Provider Notes (Signed)
History     CSN: [id number]  Arrival date & time 12/04/12  1340   First MD Initiated Contact with Patient 12/04/12 1411      No chief complaint on file.   (Consider location/radiation/quality/duration/timing/severity/associated sxs/prior treatment) HPI   Trevor Collins is a 51 y.o. male who presents today with urinary symptoms for a few days. + history of GC. + dysuria No frequency No urgency No hematuria + yellow penile discharge No fever/chills No lower abdominal pain No back pain No fatigue    Past Medical History  Diagnosis Date  . HIV positive   . Gonorrhea     Past Surgical History  Procedure Date  . Cholecystectomy     No family history on file.  History  Substance Use Topics  . Smoking status: Current Some Day Smoker  . Smokeless tobacco: Not on file  . Alcohol Use: No      Review of Systems  All other systems reviewed and are negative.    Allergies  Review of patient's allergies indicates no known allergies.  Home Medications   Current Outpatient Rx  Name  Route  Sig  Dispense  Refill  . AZITHROMYCIN 250 MG PO TABS      Take 1 gram PO x1 dose.  Disp QS   4 each   0   . AZITHROMYCIN 250 MG PO TABS      Take 1 gram PO x1 dose, Disp QS   4 each   0   . ELVITEG-COBICIS-EMTRICIT-TENOF 150-150-200-300 MG PO TABS   Oral   Take 2 tablets by mouth daily with breakfast.           There were no vitals taken for this visit.  Physical Exam  Nursing note and vitals reviewed. Constitutional: He is oriented to person, place, and time. He appears well-developed and well-nourished.  HENT:  Head: Normocephalic and atraumatic.  Eyes: No scleral icterus.  Neck: Neck supple.  Cardiovascular: Regular rhythm and normal heart sounds.   Pulmonary/Chest: Effort normal and breath sounds normal. No respiratory distress.  Genitourinary:       GU exam deferred  Neurological: He is alert and oriented to person, place, and time.  Skin: Skin is warm and  dry.  Psychiatric: He has a normal mood and affect. His speech is normal.    ED Course  Procedures (including critical care time)  Labs Reviewed - No data to display No results found.   1. Gonococcal urethritis   2. Dysuria       MDM   Hydration UA dirty catch + UCx Urine GC & Chl Rocephin 250 IM Azithomycin 1 gram x1 dose Safe sex Follow up with PCP or urology if recurrent symptoms or unsuccessful treatment  Marlaine Hind, MD 12/04/12 1436

## 2012-12-04 NOTE — ED Notes (Signed)
Patient c/o burning with urination 

## 2012-12-06 ENCOUNTER — Telehealth: Payer: Self-pay | Admitting: *Deleted

## 2012-12-06 LAB — GC/CHLAMYDIA PROBE AMP, URINE: Chlamydia, Swab/Urine, PCR: NEGATIVE

## 2013-01-25 ENCOUNTER — Emergency Department (HOSPITAL_COMMUNITY)
Admission: EM | Admit: 2013-01-25 | Discharge: 2013-01-26 | Disposition: A | Discharge status: Home / Self Care | Payer: 59 | Attending: Emergency Medicine | Admitting: Emergency Medicine

## 2013-01-25 ENCOUNTER — Encounter (HOSPITAL_COMMUNITY): Payer: Self-pay | Admitting: Emergency Medicine

## 2013-01-25 DIAGNOSIS — F17200 Nicotine dependence, unspecified, uncomplicated: Secondary | ICD-10-CM | POA: Insufficient documentation

## 2013-01-25 DIAGNOSIS — Z8619 Personal history of other infectious and parasitic diseases: Secondary | ICD-10-CM | POA: Insufficient documentation

## 2013-01-25 DIAGNOSIS — L253 Unspecified contact dermatitis due to other chemical products: Secondary | ICD-10-CM

## 2013-01-25 DIAGNOSIS — Z79899 Other long term (current) drug therapy: Secondary | ICD-10-CM | POA: Insufficient documentation

## 2013-01-25 DIAGNOSIS — Z21 Asymptomatic human immunodeficiency virus [HIV] infection status: Secondary | ICD-10-CM | POA: Insufficient documentation

## 2013-01-25 MED ORDER — FAMOTIDINE 20 MG PO TABS
20.0000 mg | ORAL_TABLET | Freq: Once | ORAL | Status: AC
  Administered 2013-01-26: 20 mg via ORAL
  Filled 2013-01-25: qty 1

## 2013-01-25 MED ORDER — DIPHENHYDRAMINE HCL 25 MG PO CAPS
25.0000 mg | ORAL_CAPSULE | Freq: Once | ORAL | Status: AC
  Administered 2013-01-26: 25 mg via ORAL
  Filled 2013-01-25: qty 1

## 2013-01-25 MED ORDER — PREDNISONE 20 MG PO TABS
60.0000 mg | ORAL_TABLET | Freq: Once | ORAL | Status: AC
  Administered 2013-01-26: 60 mg via ORAL
  Filled 2013-01-25: qty 3

## 2013-01-25 NOTE — ED Notes (Addendum)
Pt reports increased swelling in his throat.  Small red bumps noted on pt's chin.  Pt reports the bumps itch.  Pt took a benadryl early this evening but with no relief.

## 2013-01-25 NOTE — ED Notes (Signed)
Pt states he used Just For Men on his beard on Sunday and developed a rash after using it  Pt states the rash has gotten worse everyday since and now is causing him discomfort when he opens his mouth  Pt denies any difficulty breathing or swallowing

## 2013-01-26 MED ORDER — FAMOTIDINE 20 MG PO TABS: 20.0000 mg | ORAL_TABLET | Freq: Two times a day (BID) | ORAL | Status: DC

## 2013-01-26 MED ORDER — DIPHENHYDRAMINE HCL 25 MG PO TABS: 25.0000 mg | ORAL_TABLET | Freq: Four times a day (QID) | ORAL | Status: DC

## 2013-01-26 MED ORDER — PREDNISONE 20 MG PO TABS: 60.0000 mg | ORAL_TABLET | Freq: Every day | ORAL | Status: DC

## 2013-01-26 NOTE — ED Provider Notes (Signed)
History     CSN: [id number]  Arrival date & time 01/25/13  2152   First MD Initiated Contact with Patient 01/25/13 2347      Chief Complaint  Patient presents with  . Allergic Reaction    (Consider location/radiation/quality/duration/timing/severity/associated sxs/prior treatment) HPI Hx per PT. Used topical Just for Men to his beard 3 days ago and has had itchy irritated area of red raised rash ever since. No SOB or tongue/ lip swelling. Has tried benadryl but has persistent symptoms. No trouble speaking/ swallowing. No h/o same. MOD in severity. No spreading rash.    Past Medical History  Diagnosis Date  . HIV positive   . Gonorrhea     Past Surgical History  Procedure Laterality Date  . Cholecystectomy      Family History  Problem Relation Age of Onset  . Hypertension Other   . Diabetes Other   . Cancer Other     History  Substance Use Topics  . Smoking status: Current Some Day Smoker  . Smokeless tobacco: Not on file  . Alcohol Use: No      Review of Systems  Constitutional: Negative for fever and chills.  HENT: Negative for neck pain and neck stiffness.   Eyes: Negative for pain.  Respiratory: Negative for shortness of breath.   Cardiovascular: Negative for chest pain.  Gastrointestinal: Negative for abdominal pain.  Genitourinary: Negative for dysuria.  Musculoskeletal: Negative for back pain.  Skin: Positive for rash.  Neurological: Negative for headaches.  All other systems reviewed and are negative.    Allergies  Morphine and related  Home Medications   Current Outpatient Rx  Name  Route  Sig  Dispense  Refill  . diphenhydrAMINE (BENADRYL) 25 mg capsule   Oral   Take 25 mg by mouth every 6 (six) hours as needed for itching or allergies.         Marland Kitchen elvitegravir-cobicistat-emtricitabine-tenofovir (STRIBILD) 150-150-200-300 MG TABS   Oral   Take 2 tablets by mouth daily with breakfast.           BP 125/69  Pulse 73  Temp(Src)  99.1 F (37.3 C) (Oral)  Resp 18  SpO2 100%  Physical Exam  Constitutional: He is oriented to person, place, and time. He appears well-developed and well-nourished.  HENT:  Head: Normocephalic and atraumatic.  Mouth/Throat: Oropharynx is clear and moist. No oropharyngeal exudate.  Eyes: EOM are normal. Pupils are equal, round, and reactive to light.  Neck: Neck supple.  Cardiovascular: Normal rate, regular rhythm and intact distal pulses.   Pulmonary/Chest: Effort normal and breath sounds normal. No stridor. No respiratory distress. He has no wheezes.  Musculoskeletal: Normal range of motion. He exhibits no edema.  Neurological: He is alert and oriented to person, place, and time.  Skin: Skin is warm and dry.  Erythematous papular rash to chin. No vesicles or petechiae    ED Course  Procedures (including critical care time)  Prednisone, benadryl and pepcid provided  MDM  Contact dermatitis treated with medications as above.   Rx provided  VS and nursing notes reviewed   Return precautions verbalized as understood        Sunnie Nielsen, MD 01/26/13 [phone]

## 2013-11-03 ENCOUNTER — Emergency Department (HOSPITAL_COMMUNITY): Payer: 59

## 2013-11-03 ENCOUNTER — Encounter (HOSPITAL_COMMUNITY): Payer: Self-pay | Admitting: Emergency Medicine

## 2013-11-03 ENCOUNTER — Emergency Department (HOSPITAL_COMMUNITY)
Admission: EM | Admit: 2013-11-03 | Discharge: 2013-11-03 | Disposition: A | Discharge status: Home / Self Care | Payer: 59 | Attending: Emergency Medicine | Admitting: Emergency Medicine

## 2013-11-03 DIAGNOSIS — S93401A Sprain of unspecified ligament of right ankle, initial encounter: Secondary | ICD-10-CM

## 2013-11-03 DIAGNOSIS — S93409A Sprain of unspecified ligament of unspecified ankle, initial encounter: Secondary | ICD-10-CM | POA: Insufficient documentation

## 2013-11-03 DIAGNOSIS — S90511A Abrasion, right ankle, initial encounter: Secondary | ICD-10-CM

## 2013-11-03 DIAGNOSIS — IMO0002 Reserved for concepts with insufficient information to code with codable children: Secondary | ICD-10-CM | POA: Insufficient documentation

## 2013-11-03 DIAGNOSIS — Z21 Asymptomatic human immunodeficiency virus [HIV] infection status: Secondary | ICD-10-CM | POA: Insufficient documentation

## 2013-11-03 DIAGNOSIS — Z8619 Personal history of other infectious and parasitic diseases: Secondary | ICD-10-CM | POA: Insufficient documentation

## 2013-11-03 DIAGNOSIS — Y9351 Activity, roller skating (inline) and skateboarding: Secondary | ICD-10-CM | POA: Insufficient documentation

## 2013-11-03 DIAGNOSIS — Y9289 Other specified places as the place of occurrence of the external cause: Secondary | ICD-10-CM | POA: Insufficient documentation

## 2013-11-03 DIAGNOSIS — F17200 Nicotine dependence, unspecified, uncomplicated: Secondary | ICD-10-CM | POA: Insufficient documentation

## 2013-11-03 MED ORDER — IBUPROFEN 600 MG PO TABS: 600.0000 mg | ORAL_TABLET | Freq: Four times a day (QID) | ORAL | Status: AC | PRN

## 2013-11-03 NOTE — ED Notes (Signed)
Bacitracin and band-aid placed on wound on R ankle.

## 2013-11-03 NOTE — ED Provider Notes (Signed)
Medical screening examination/treatment/procedure(s) were performed by non-physician practitioner and as supervising physician I was immediately available for consultation/collaboration.  EKG Interpretation   None        Juliet Rude. Rubin Payor, MD 11/03/13 2324

## 2013-11-03 NOTE — ED Notes (Signed)
Pt states he fell off a skateboard going down a hill. Pt c/o R ankle and R foot pain. States he cannot bear wt to R leg due to pain. Pt arrives in wheelchair to room.

## 2013-11-03 NOTE — ED Notes (Signed)
Ortho tech called for application of ASO and crutches.  

## 2013-11-03 NOTE — ED Provider Notes (Signed)
CSN: [id number]     Arrival date & time 11/03/13  1622 History  This chart was scribed for non-physician practitioner Jaynie Crumble, PA-C, working with Juliet Rude. Rubin Payor, MD by Dorothey Baseman, ED Scribe. This patient was seen in room WTR9/WTR9 and the patient's care was started at 5:05 PM.    Chief Complaint  Patient presents with  . Ankle Injury  . Foot Pain   The history is provided by the patient. No language interpreter was used.   HPI Comments: Trevor Collins is a 52 y.o. male who presents to the Emergency Department complaining of a constant pain to the right ankle and right foot onset about half an hour  ago when he states that he fell off of a skateboard while going down a hill and the foot bent backwards. He denies hitting his head or loss of consciousness. Patient also has an abrasion to the right ankle secondary to the fall. Patient denies applying ice to the area or taking any medications at home to treat his symptoms. Pt states pain worsened with bearing weight and movement of the ankle. Nothing makes it better.  Patient reports that his tetanus vaccination is UTD. Patient is HIV positive.  Past Medical History  Diagnosis Date  . HIV positive   . Gonorrhea    Past Surgical History  Procedure Laterality Date  . Cholecystectomy     Family History  Problem Relation Age of Onset  . Hypertension Other   . Diabetes Other   . Cancer Other    History  Substance Use Topics  . Smoking status: Current Some Day Smoker  . Smokeless tobacco: Not on file  . Alcohol Use: No    Review of Systems  Musculoskeletal: Positive for arthralgias and myalgias.  Skin: Positive for wound.  Neurological: Negative for syncope.    Allergies  Morphine and related  Home Medications   Current Outpatient Rx  Name  Route  Sig  Dispense  Refill  . elvitegravir-cobicistat-emtricitabine-tenofovir (STRIBILD) 150-150-200-300 MG TABS   Oral   Take 2 tablets by mouth daily with breakfast.         Triage Vitals: BP 116/77  Pulse 88  Temp(Src) 98.7 F (37.1 C) (Oral)  Resp 20  SpO2 99%  Physical Exam  Nursing note and vitals reviewed. Constitutional: He is oriented to person, place, and time. He appears well-developed and well-nourished. No distress.  HENT:  Head: Normocephalic and atraumatic.  Eyes: Conjunctivae are normal.  Neck: Normal range of motion. Neck supple.  Pulmonary/Chest: Effort normal. No respiratory distress.  Abdominal: He exhibits no distension.  Musculoskeletal: Normal range of motion.  No medial malleolus tenderness. Tenderness to palpation to the lateral malleolus with associated ecchymosis and a small abrasion. No pain to the right foot. Full range of motion of all of the toes of the right foot. Unable to test range of motion of the ankle secondary to pain. Achilles tendon intact.   Neurological: He is alert and oriented to person, place, and time.  Skin: Skin is warm and dry.  Psychiatric: He has a normal mood and affect. His behavior is normal.    ED Course  Procedures (including critical care time)  DIAGNOSTIC STUDIES: Oxygen Saturation is 99% on room air, normal by my interpretation.    COORDINATION OF CARE: 5:08 PM- Ordered x-rays of the right ankle and foot. Discussed treatment plan with patient at bedside and patient verbalized agreement.   5:10 PM- Discussed that x-ray results do not indicate  any fractures or dislocations. Will discharge patient with crutches and an ASO wrap. Offered patient pain medication, but he refused. Discussed treatment plan with patient at bedside and patient verbalized agreement.    Labs Review Labs Reviewed - No data to display  Imaging Review Dg Ankle Complete Right  11/03/2013   CLINICAL DATA:  Fall.  EXAM: RIGHT ANKLE - COMPLETE 3+ VIEW  COMPARISON:  None.  FINDINGS: Soft tissue swelling noted over the lateral malleolus. No evidence of fracture dislocation.  IMPRESSION: Soft tissue swelling noted  over lateral malleolus. No evidence of fracture or dislocation.   Electronically Signed   By: Maisie Fus  Register   On: 11/03/2013 17:04   Dg Foot Complete Right  11/03/2013   CLINICAL DATA:  Fall.  EXAM: RIGHT FOOT COMPLETE - 3+ VIEW  COMPARISON:  None.  FINDINGS: Soft tissue swelling is noted over the lateral malleolus. There is no evidence of fracture or dislocation. The foot appears intact. No foreign body noted.  IMPRESSION: Negative.   Electronically Signed   By: Maisie Fus  Register   On: 11/03/2013 17:04    EKG Interpretation   None       MDM   1. Ankle sprain, right, initial encounter   2. Abrasion of right ankle, initial encounter     Pt with right ankle injury after skateboarding down the hill. X-rays negative. Small abrasion that was cleaned and dressed in ED. ASO splint and crutches. NSAIDs at home, elevation, follow up with PCP. Pt's tetanus is up to date.   Filed Vitals:   11/03/13 1627  BP: 116/77  Pulse: 88  Temp: 98.7 F (37.1 C)  TempSrc: Oral  Resp: 20  SpO2: 99%    I personally performed the services described in this documentation, which was scribed in my presence. The recorded information has been reviewed and is accurate.     Lottie Mussel, PA-C 11/03/13 1949

## 2014-10-08 ENCOUNTER — Emergency Department (HOSPITAL_COMMUNITY)
Admission: EM | Admit: 2014-10-08 | Discharge: 2014-10-08 | Disposition: A | Discharge status: Home / Self Care | Payer: 59 | Attending: Emergency Medicine | Admitting: Emergency Medicine

## 2014-10-08 ENCOUNTER — Encounter (HOSPITAL_COMMUNITY): Payer: Self-pay | Admitting: Emergency Medicine

## 2014-10-08 DIAGNOSIS — M5441 Lumbago with sciatica, right side: Secondary | ICD-10-CM | POA: Diagnosis not present

## 2014-10-08 DIAGNOSIS — Z720 Tobacco use: Secondary | ICD-10-CM | POA: Diagnosis not present

## 2014-10-08 DIAGNOSIS — Z79899 Other long term (current) drug therapy: Secondary | ICD-10-CM | POA: Diagnosis not present

## 2014-10-08 DIAGNOSIS — Z21 Asymptomatic human immunodeficiency virus [HIV] infection status: Secondary | ICD-10-CM | POA: Insufficient documentation

## 2014-10-08 DIAGNOSIS — Z8619 Personal history of other infectious and parasitic diseases: Secondary | ICD-10-CM | POA: Diagnosis not present

## 2014-10-08 DIAGNOSIS — M545 Low back pain: Secondary | ICD-10-CM | POA: Diagnosis present

## 2014-10-08 MED ORDER — DIAZEPAM 5 MG/ML IJ SOLN
3.7500 mg | Freq: Once | INTRAMUSCULAR | Status: AC
  Administered 2014-10-08: 3.75 mg via INTRAMUSCULAR

## 2014-10-08 MED ORDER — FENTANYL CITRATE 0.05 MG/ML IJ SOLN
50.0000 ug | Freq: Once | INTRAMUSCULAR | Status: AC
  Administered 2014-10-08: 50 ug via INTRAMUSCULAR
  Filled 2014-10-08: qty 2

## 2014-10-08 MED ORDER — DIAZEPAM 5 MG/ML IJ SOLN
3.7500 mg | Freq: Once | INTRAMUSCULAR | Status: DC
  Filled 2014-10-08: qty 2

## 2014-10-08 MED ORDER — KETOROLAC TROMETHAMINE 60 MG/2ML IM SOLN
60.0000 mg | Freq: Once | INTRAMUSCULAR | Status: AC
  Administered 2014-10-08: 60 mg via INTRAMUSCULAR
  Filled 2014-10-08: qty 2

## 2014-10-08 MED ORDER — MELOXICAM 7.5 MG PO TABS: 15.0000 mg | ORAL_TABLET | Freq: Every day | ORAL | Status: AC

## 2014-10-08 MED ORDER — OXYCODONE-ACETAMINOPHEN 5-325 MG PO TABS
2.0000 | ORAL_TABLET | Freq: Once | ORAL | Status: AC
  Administered 2014-10-08: 2 via ORAL
  Filled 2014-10-08: qty 2

## 2014-10-08 MED ORDER — PREDNISONE 20 MG PO TABS
60.0000 mg | ORAL_TABLET | Freq: Once | ORAL | Status: AC
  Administered 2014-10-08: 60 mg via ORAL
  Filled 2014-10-08: qty 3

## 2014-10-08 MED ORDER — OXYCODONE-ACETAMINOPHEN 5-325 MG PO TABS: 1.0000 | ORAL_TABLET | Freq: Four times a day (QID) | ORAL | Status: AC | PRN

## 2014-10-08 MED ORDER — METHOCARBAMOL 500 MG PO TABS: 500.0000 mg | ORAL_TABLET | Freq: Two times a day (BID) | ORAL | Status: AC

## 2014-10-08 NOTE — Discharge Instructions (Signed)
Take Mobic and Robaxin as prescribed. Take Percocet in addition if you require additional pain control. Alternate ice and heat to your back. Refrain from strenuous activity or heavy lifting. Follow up with your primary care doctor to ensure resolution of symptoms.  Back Pain, Adult Low back pain is very common. About 1 in 5 people have back pain.The cause of low back pain is rarely dangerous. The pain often gets better over time.About half of people with a sudden onset of back pain feel better in just 2 weeks. About 8 in 10 people feel better by 6 weeks.  CAUSES Some common causes of back pain include:  Strain of the muscles or ligaments supporting the spine.  Wear and tear (degeneration) of the spinal discs.  Arthritis.  Direct injury to the back. DIAGNOSIS Most of the time, the direct cause of low back pain is not known.However, back pain can be treated effectively even when the exact cause of the pain is unknown.Answering your caregiver's questions about your overall health and symptoms is one of the most accurate ways to make sure the cause of your pain is not dangerous. If your caregiver needs more information, he or she may order lab work or imaging tests (X-rays or MRIs).However, even if imaging tests show changes in your back, this usually does not require surgery. HOME CARE INSTRUCTIONS For many people, back pain returns.Since low back pain is rarely dangerous, it is often a condition that people can learn to [facility] LLCmanageon their own.   Remain active. It is stressful on the back to sit or stand in one place. Do not sit, drive, or stand in one place for more than 30 minutes at a time. Take short walks on level surfaces as soon as pain allows.Try to increase the length of time you walk each day.  Do not stay in bed.Resting more than 1 or 2 days can delay your recovery.  Do not avoid exercise or work.Your body is made to move.It is not dangerous to be active, even though your back  may hurt.Your back will likely heal faster if you return to being active before your pain is gone.  Pay attention to your body when you bend and lift. Many people have less discomfortwhen lifting if they bend their knees, keep the load close to their bodies,and avoid twisting. Often, the most comfortable positions are those that put less stress on your recovering back.  Find a comfortable position to sleep. Use a firm mattress and lie on your side with your knees slightly bent. If you lie on your back, put a pillow under your knees.  Only take over-the-counter or prescription medicines as directed by your caregiver. Over-the-counter medicines to reduce pain and inflammation are often the most helpful.Your caregiver may prescribe muscle relaxant drugs.These medicines help dull your pain so you can more quickly return to your normal activities and healthy exercise.  Put ice on the injured area.  Put ice in a plastic bag.  Place a towel between your skin and the bag.  Leave the ice on for 15-20 minutes, 03-04 times a day for the first 2 to 3 days. After that, ice and heat may be alternated to reduce pain and spasms.  Ask your caregiver about trying back exercises and gentle massage. This may be of some benefit.  Avoid feeling anxious or stressed.Stress increases muscle tension and can worsen back pain.It is important to recognize when you are anxious or stressed and learn ways to manage it.Exercise is  a great option. SEEK MEDICAL CARE IF:  You have pain that is not relieved with rest or medicine.  You have pain that does not improve in 1 week.  You have new symptoms.  You are generally not feeling well. SEEK IMMEDIATE MEDICAL CARE IF:   You have pain that radiates from your back into your legs.  You develop new bowel or bladder control problems.  You have unusual weakness or numbness in your arms or legs.  You develop nausea or vomiting.  You develop abdominal pain.  You  feel faint. Document Released: 11/23/2005 Document Revised: 05/24/2012 Document Reviewed: 03/27/2014 [facility] Patient Information 2015 [place], [place]. This information is not intended to replace advice given to you by your health care provider. Make sure you discuss any questions you have with your health care provider. Sciatica Sciatica is pain, weakness, numbness, or tingling along the path of the sciatic nerve. The nerve starts in the lower back and runs down the back of each leg. The nerve controls the muscles in the lower leg and in the back of the knee, while also providing sensation to the back of the thigh, lower leg, and the sole of your foot. Sciatica is a symptom of another medical condition. For instance, nerve damage or certain conditions, such as a herniated disk or bone spur on the spine, pinch or put pressure on the sciatic nerve. This causes the pain, weakness, or other sensations normally associated with sciatica. Generally, sciatica only affects one side of the body. CAUSES   Herniated or slipped disc.  Degenerative disk disease.  A pain disorder involving the narrow muscle in the buttocks (piriformis syndrome).  Pelvic injury or fracture.  Pregnancy.  Tumor (rare). SYMPTOMS  Symptoms can vary from mild to very severe. The symptoms usually travel from the low back to the buttocks and down the back of the leg. Symptoms can include:  Mild tingling or dull aches in the lower back, leg, or hip.  Numbness in the back of the calf or sole of the foot.  Burning sensations in the lower back, leg, or hip.  Sharp pains in the lower back, leg, or hip.  Leg weakness.  Severe back pain inhibiting movement. These symptoms may get worse with coughing, sneezing, laughing, or prolonged sitting or standing. Also, being overweight may worsen symptoms. DIAGNOSIS  Your caregiver will perform a physical exam to look for common symptoms of sciatica. He or she may ask you to do certain  movements or activities that would trigger sciatic nerve pain. Other tests may be performed to find the cause of the sciatica. These may include:  Blood tests.  X-rays.  Imaging tests, such as an MRI or CT scan. TREATMENT  Treatment is directed at the cause of the sciatic pain. Sometimes, treatment is not necessary and the pain and discomfort goes away on its own. If treatment is needed, your caregiver may suggest:  Over-the-counter medicines to relieve pain.  Prescription medicines, such as anti-inflammatory medicine, muscle relaxants, or narcotics.  Applying heat or ice to the painful area.  Steroid injections to lessen pain, irritation, and inflammation around the nerve.  Reducing activity during periods of pain.  Exercising and stretching to strengthen your abdomen and improve flexibility of your spine. Your caregiver may suggest losing weight if the extra weight makes the back pain worse.  Physical therapy.  Surgery to eliminate what is pressing or pinching the nerve, such as a bone spur or part of a herniated disk. HOME  CARE INSTRUCTIONS   Only take over-the-counter or prescription medicines for pain or discomfort as directed by your caregiver.  Apply ice to the affected area for 20 minutes, 3-4 times a day for the first 48-72 hours. Then try heat in the same way.  Exercise, stretch, or perform your usual activities if these do not aggravate your pain.  Attend physical therapy sessions as directed by your caregiver.  Keep all follow-up appointments as directed by your caregiver.  Do not wear high heels or shoes that do not provide proper support.  Check your mattress to see if it is too soft. A firm mattress may lessen your pain and discomfort. SEEK IMMEDIATE MEDICAL CARE IF:   You lose control of your bowel or bladder (incontinence).  You have increasing weakness in the lower back, pelvis, buttocks, or legs.  You have redness or swelling of your back.  You have  a burning sensation when you urinate.  You have pain that gets worse when you lie down or awakens you at night.  Your pain is worse than you have experienced in the past.  Your pain is lasting longer than 4 weeks.  You are suddenly losing weight without reason. MAKE SURE YOU:  Understand these instructions.  Will watch your condition.  Will get help right away if you are not doing well or get worse. Document Released: 11/17/2001 Document Revised: 05/24/2012 Document Reviewed: 04/03/2012 [facility] Patient Information 2015 [place], [place]. This information is not intended to replace advice given to you by your health care provider. Make sure you discuss any questions you have with your health care provider.

## 2014-10-08 NOTE — ED Provider Notes (Signed)
CSN: [id number]     Arrival date & time 10/08/14  1851 History   First MD Initiated Contact with Patient 10/08/14 2013     Chief Complaint  Patient presents with  . Back Pain  . Leg Pain    (Consider location/radiation/quality/duration/timing/severity/associated sxs/prior Treatment) HPI Comments: Patient is a 53 year old male with a history of HIV and gonorrhea who presents to the emergency department for further evaluation of low back pain. Patient states that he was changing a tire 5 days ago when he noticed some discomfort in his right low back. Patient states that he originally continued to have pain in his right low back and buttocks, but now has most of this pain located in his posterior right thigh. Patient states the pain radiates down his right lower extremity. Pain is worse with movement and ambulation. Patient has been taking Tylenol for symptoms without improvement. He has also been using warm bath soaks with some mild temporary relief. Patient denies associated fever, urinary symptoms, bowel or bladder incontinence, extremity numbness or weakness, a history of cancer, or a history of IV drug use. He denies any direct trauma or injury to his back inciting symptoms.  Patient is a 53 y.o. male presenting with back pain and leg pain. The history is provided by the patient. No language interpreter was used.  Back Pain Associated symptoms: leg pain   Associated symptoms: no fever, no numbness and no weakness   Leg Pain Associated symptoms: back pain   Associated symptoms: no fever     Past Medical History  Diagnosis Date  . HIV positive   . Gonorrhea    Past Surgical History  Procedure Laterality Date  . Cholecystectomy     Family History  Problem Relation Age of Onset  . Hypertension Other   . Diabetes Other   . Cancer Other    History  Substance Use Topics  . Smoking status: Current Some Day Smoker  . Smokeless tobacco: Not on file  . Alcohol Use: No    Review of  Systems  Constitutional: Negative for fever.  Genitourinary:       Negative incontinence  Musculoskeletal: Positive for back pain.  Neurological: Negative for weakness and numbness.       +tingling in RLE  All other systems reviewed and are negative.   Allergies  Morphine and related  Home Medications   Prior to Admission medications   Medication Sig Start Date End Date Taking? Authorizing Provider  elvitegravir-cobicistat-emtricitabine-tenofovir (STRIBILD) 150-150-200-300 MG TABS Take 2 tablets by mouth daily with breakfast.    Historical Provider, MD  ibuprofen (ADVIL,MOTRIN) 600 MG tablet Take 1 tablet (600 mg total) by mouth every 6 (six) hours as needed. 11/03/13   Tatyana A Kirichenko, PA-C   BP 130/83 mmHg  Pulse 68  Temp(Src) 98.1 F (36.7 C) (Oral)  Resp 16  Ht 5\' 11"  (1.803 m)  Wt 172 lb (78.019 kg)  BMI 24.00 kg/m2  SpO2 97%   Physical Exam  Constitutional: He is oriented to person, place, and time. He appears well-developed and well-nourished. No distress.  Nontoxic/nonseptic appearing  HENT:  Head: Normocephalic and atraumatic.  Eyes: Conjunctivae and EOM are normal. No scleral icterus.  Neck: Normal range of motion.  Cardiovascular: Normal rate, regular rhythm and intact distal pulses.   DP and PT pulses 2+ bilaterally  Pulmonary/Chest: Effort normal. No respiratory distress.  Musculoskeletal: Normal range of motion. He exhibits tenderness.       Lumbar back: He exhibits tenderness.  He exhibits normal range of motion, no bony tenderness (no TTP the the lumbar midline), no deformity, no pain and no spasm.       Back:  Positive straight leg raise and crossed straight leg raise causing pain in posterior R thigh and R buttocks.  Neurological: He is alert and oriented to person, place, and time. He exhibits normal muscle tone. Coordination normal.  Sensation to light touch intact. Patient moves extremities without ataxia.  Skin: Skin is warm and dry. No rash  noted. He is not diaphoretic. No erythema. No pallor.  Psychiatric: He has a normal mood and affect. His behavior is normal.  Nursing note and vitals reviewed.   ED Course  Procedures (including critical care time) Labs Review Labs Reviewed - No data to display  Imaging Review No results found.   EKG Interpretation None      MDM   Final diagnoses:  Right-sided low back pain with right-sided sciatica    Patient with back pain and sciatica. Symptom onset after changing a tire 5 days ago. Patient neurovascularly intact and without sensory deficits. Patient can walk but states is painful. No loss of bowel or bladder control. No concern for cauda equina. No fever, hx of CA, or hx of IVDU. Pain controlled in ED with NSAIDs, muscle relaxers, and pain medication. No indication for further workup or imaging. RICE protocol and pain medicine indicated and discussed with patient. Return precautions discussed and provided. Patient agreeable to plan with no unaddressed concerns.   Filed Vitals:   10/08/14 1901 10/08/14 2249  BP: 130/83 110/68  Pulse: 68 61  Temp: 98.1 F (36.7 C)   TempSrc: Oral   Resp: 16 20  Height: 5\' 11"  (1.803 m)   Weight: 172 lb (78.019 kg)   SpO2: 97% 97%     Antony MaduraKelly Shawnette Augello, PA-C 10/08/14 2303

## 2014-10-08 NOTE — ED Notes (Signed)
Pt A&OX4, ambulatory at d/c with steady gait, NAD 

## 2014-10-08 NOTE — ED Notes (Signed)
Kelly, PA at bedside. 

## 2014-10-08 NOTE — ED Notes (Signed)
While ambulating pt, pt stated his pain was better then when he first arrived. He also stated that he was a little dizzy while walking. No other complaints.

## 2014-10-08 NOTE — ED Notes (Signed)
Pt c/o lower right sided back and leg pain after bending over to pick up something

## 2018-09-25 ENCOUNTER — Other Ambulatory Visit: Payer: Self-pay

## 2018-09-25 ENCOUNTER — Encounter (HOSPITAL_COMMUNITY): Payer: Self-pay | Admitting: *Deleted

## 2018-09-25 ENCOUNTER — Emergency Department (HOSPITAL_COMMUNITY): Payer: BLUE CROSS/BLUE SHIELD

## 2018-09-25 ENCOUNTER — Inpatient Hospital Stay (HOSPITAL_COMMUNITY)
Admission: EM | Admit: 2018-09-25 | Discharge: 2018-09-29 | DRG: 372 | Disposition: A | Discharge status: Home / Self Care | Payer: BLUE CROSS/BLUE SHIELD | Attending: Internal Medicine | Admitting: Internal Medicine

## 2018-09-25 DIAGNOSIS — F17200 Nicotine dependence, unspecified, uncomplicated: Secondary | ICD-10-CM | POA: Diagnosis present

## 2018-09-25 DIAGNOSIS — A09 Infectious gastroenteritis and colitis, unspecified: Secondary | ICD-10-CM | POA: Diagnosis not present

## 2018-09-25 DIAGNOSIS — E876 Hypokalemia: Secondary | ICD-10-CM | POA: Diagnosis present

## 2018-09-25 DIAGNOSIS — R112 Nausea with vomiting, unspecified: Secondary | ICD-10-CM | POA: Diagnosis not present

## 2018-09-25 DIAGNOSIS — B20 Human immunodeficiency virus [HIV] disease: Secondary | ICD-10-CM | POA: Diagnosis not present

## 2018-09-25 DIAGNOSIS — Z21 Asymptomatic human immunodeficiency virus [HIV] infection status: Secondary | ICD-10-CM | POA: Diagnosis present

## 2018-09-25 DIAGNOSIS — Z885 Allergy status to narcotic agent status: Secondary | ICD-10-CM

## 2018-09-25 DIAGNOSIS — N179 Acute kidney failure, unspecified: Secondary | ICD-10-CM | POA: Diagnosis present

## 2018-09-25 DIAGNOSIS — A039 Shigellosis, unspecified: Secondary | ICD-10-CM | POA: Diagnosis present

## 2018-09-25 DIAGNOSIS — Z9049 Acquired absence of other specified parts of digestive tract: Secondary | ICD-10-CM | POA: Diagnosis not present

## 2018-09-25 DIAGNOSIS — Z79899 Other long term (current) drug therapy: Secondary | ICD-10-CM

## 2018-09-25 DIAGNOSIS — E860 Dehydration: Secondary | ICD-10-CM | POA: Diagnosis present

## 2018-09-25 DIAGNOSIS — A419 Sepsis, unspecified organism: Secondary | ICD-10-CM | POA: Diagnosis not present

## 2018-09-25 DIAGNOSIS — R1110 Vomiting, unspecified: Secondary | ICD-10-CM

## 2018-09-25 DIAGNOSIS — R197 Diarrhea, unspecified: Secondary | ICD-10-CM

## 2018-09-25 HISTORY — DX: Human immunodeficiency virus (HIV) disease: B20

## 2018-09-25 LAB — URINALYSIS, ROUTINE W REFLEX MICROSCOPIC
Glucose, UA: NEGATIVE mg/dL
HGB URINE DIPSTICK: NEGATIVE
Ketones, ur: 5 mg/dL — AB
Leukocytes, UA: NEGATIVE
Nitrite: NEGATIVE
PH: 5 (ref 5.0–8.0)
PROTEIN: 100 mg/dL — AB
Specific Gravity, Urine: 1.026 (ref 1.005–1.030)

## 2018-09-25 LAB — CBC
HEMATOCRIT: 45.3 % (ref 39.0–52.0)
Hemoglobin: 14.8 g/dL (ref 13.0–17.0)
MCH: 27.1 pg (ref 26.0–34.0)
MCHC: 32.7 g/dL (ref 30.0–36.0)
MCV: 83 fL (ref 80.0–100.0)
NRBC: 0 % (ref 0.0–0.2)
Platelets: 254 10*3/uL (ref 150–400)
RBC: 5.46 MIL/uL (ref 4.22–5.81)
RDW: 13.9 % (ref 11.5–15.5)
WBC: 12.9 10*3/uL — ABNORMAL HIGH (ref 4.0–10.5)

## 2018-09-25 LAB — COMPREHENSIVE METABOLIC PANEL
ALT: 24 U/L (ref 0–44)
AST: 29 U/L (ref 15–41)
Albumin: 4.2 g/dL (ref 3.5–5.0)
Alkaline Phosphatase: 64 U/L (ref 38–126)
Anion gap: 14 (ref 5–15)
BUN: 12 mg/dL (ref 6–20)
CHLORIDE: 100 mmol/L (ref 98–111)
CO2: 24 mmol/L (ref 22–32)
CREATININE: 1.98 mg/dL — AB (ref 0.61–1.24)
Calcium: 9.1 mg/dL (ref 8.9–10.3)
GFR calc Af Amer: 42 mL/min — ABNORMAL LOW (ref >60)
GFR calc non Af Amer: 36 mL/min — ABNORMAL LOW (ref >60)
Glucose, Bld: 124 mg/dL — ABNORMAL HIGH (ref 70–99)
Potassium: 3.4 mmol/L — ABNORMAL LOW (ref 3.5–5.1)
Sodium: 138 mmol/L (ref 135–145)
Total Bilirubin: 1.1 mg/dL (ref 0.3–1.2)
Total Protein: 7.9 g/dL (ref 6.5–8.1)

## 2018-09-25 LAB — LIPASE, BLOOD: Lipase: 24 U/L (ref 11–51)

## 2018-09-25 MED ORDER — ACETAMINOPHEN 325 MG PO TABS
650.0000 mg | ORAL_TABLET | Freq: Once | ORAL | Status: AC
  Administered 2018-09-25: 650 mg via ORAL
  Filled 2018-09-25: qty 2

## 2018-09-25 MED ORDER — IOHEXOL 300 MG/ML  SOLN: 30.0000 mL | Freq: Once | INTRAMUSCULAR | Status: DC | PRN

## 2018-09-25 MED ORDER — CEFTRIAXONE SODIUM 1 G IJ SOLR
1.0000 g | Freq: Once | INTRAMUSCULAR | Status: AC
  Administered 2018-09-25: 1 g via INTRAVENOUS
  Filled 2018-09-25: qty 10

## 2018-09-25 MED ORDER — ONDANSETRON HCL 4 MG/2ML IJ SOLN
4.0000 mg | Freq: Once | INTRAMUSCULAR | Status: AC
  Administered 2018-09-25: 4 mg via INTRAVENOUS
  Filled 2018-09-25: qty 2

## 2018-09-25 MED ORDER — SODIUM CHLORIDE 0.9 % IV BOLUS
1000.0000 mL | Freq: Once | INTRAVENOUS | Status: AC
  Administered 2018-09-25: 1000 mL via INTRAVENOUS

## 2018-09-25 NOTE — ED Provider Notes (Signed)
[facility]-EMERGENCY DEPT Provider Note   CSN: [id number] Arrival date & time: 09/25/18  1622     History   Chief Complaint Chief Complaint  Patient presents with  . Emesis    HPI Trevor Collins is a 57 y.o. male.  57 year old male with a history of HIV presents with complaint of abdominal cramping with nausea, vomiting, diarrhea and fever.  Patient was seen at his [facility] infectious disease specialist on October 9 with same symptoms, stool culture positive for Shigella and patient was treated with Cipro twice daily for 7 days.  Patient states that on October 15 right as he finished his medication he had return of his symptoms her diarrhea is described as mucousy with pink-tinged blood, unable to tolerate anything by mouth.  Patient reports approximately 6 episodes of loose stools today as well as 4 episodes of emesis.  States that his temperature this morning was 103 by mouth, has not had anything for fever as he vomited the Motrin that he took earlier.  Denies any sick contacts but states he feels generally weak     Past Medical History:  Diagnosis Date  . Gonorrhea   . HIV positive (HCC)     There are no active problems to display for this patient.   Past Surgical History:  Procedure Laterality Date  . CHOLECYSTECTOMY          Home Medications    Prior to Admission medications   Medication Sig Start Date End Date Taking? Authorizing Provider  Doxylamine Succinate, Sleep, (SLEEP AID PO) Take 1 tablet by mouth at bedtime.   Yes [provider]  GENVOYA 150-150-200-10 MG TABS tablet Take 1 tablet by mouth daily with breakfast.  09/01/18  Yes [provider]  ranitidine (ZANTAC) 150 MG capsule Take 150 mg by mouth daily.   Yes [provider]  ibuprofen (ADVIL,MOTRIN) 600 MG tablet Take 1 tablet (600 mg total) by mouth every 6 (six) hours as needed. Patient not taking: Reported on 09/25/2018 11/03/13   Jaynie Crumble,  PA-C  meloxicam (MOBIC) 7.5 MG tablet Take 2 tablets (15 mg total) by mouth daily. Patient not taking: Reported on 09/25/2018 10/08/14   Antony Madura, PA-C  methocarbamol (ROBAXIN) 500 MG tablet Take 1 tablet (500 mg total) by mouth 2 (two) times daily. Patient not taking: Reported on 09/25/2018 10/08/14   Antony Madura, PA-C  oxyCODONE-acetaminophen (PERCOCET/ROXICET) 5-325 MG per tablet Take 1-2 tablets by mouth every 6 (six) hours as needed for moderate pain or severe pain. Patient not taking: Reported on 09/25/2018 10/08/14   Antony Madura, PA-C    Family History Family History  Problem Relation Age of Onset  . Hypertension Other   . Diabetes Other   . Cancer Other     Social History Social History   Tobacco Use  . Smoking status: Current Some Day Smoker  Substance Use Topics  . Alcohol use: No  . Drug use: No     Allergies   Morphine and related   Review of Systems Review of Systems  Constitutional: Positive for chills, diaphoresis, fatigue and fever.  Respiratory: Negative for shortness of breath.   Cardiovascular: Negative for chest pain.  Gastrointestinal: Positive for abdominal pain, blood in stool, diarrhea, nausea and vomiting. Negative for abdominal distention and constipation.  Genitourinary: Positive for decreased urine volume. Negative for difficulty urinating, dysuria and frequency.  Skin: Negative for rash and wound.  Allergic/Immunologic: Positive for immunocompromised state.  Neurological: Positive for weakness.  Hematological: Negative for adenopathy. Does not bruise/bleed easily.  Psychiatric/Behavioral: Negative for confusion.  All other systems reviewed and are negative.    Physical Exam Updated Vital Signs BP 100/61 (BP Location: Right Arm)   Pulse (!) 103   Temp (!) 102.5 F (39.2 C) (Oral)   Resp 20   Ht 5\' 11"  (1.803 m)   Wt 77.1 kg   SpO2 98%   BMI 23.71 kg/m   Physical Exam  Constitutional: He is oriented to person, place, and  time. He appears well-developed and well-nourished. No distress.  HENT:  Head: Normocephalic and atraumatic.  Cardiovascular: Normal rate, regular rhythm, normal heart sounds and intact distal pulses.  No murmur heard. Pulmonary/Chest: Effort normal and breath sounds normal. No respiratory distress.  Abdominal: Soft. He exhibits no distension. There is tenderness.  Neurological: He is alert and oriented to person, place, and time.  Skin: He is not diaphoretic.  Psychiatric: He has a normal mood and affect. His behavior is normal.  Nursing note and vitals reviewed.    ED Treatments / Results  Labs (all labs ordered are listed, but only abnormal results are displayed) Labs Reviewed  COMPREHENSIVE METABOLIC PANEL - Abnormal; Notable for the following components:      Result Value   Potassium 3.4 (*)    Glucose, Bld 124 (*)    Creatinine, Ser 1.98 (*)    GFR calc non Af Amer 36 (*)    GFR calc Af Amer 42 (*)    All other components within normal limits  CBC - Abnormal; Notable for the following components:   WBC 12.9 (*)    All other components within normal limits  URINALYSIS, ROUTINE W REFLEX MICROSCOPIC - Abnormal; Notable for the following components:   Color, Urine AMBER (*)    APPearance CLOUDY (*)    Bilirubin Urine SMALL (*)    Ketones, ur 5 (*)    Protein, ur 100 (*)    Bacteria, UA MANY (*)    All other components within normal limits  GASTROINTESTINAL PANEL BY PCR, STOOL (REPLACES STOOL CULTURE)  C DIFFICILE QUICK SCREEN W PCR REFLEX  LIPASE, BLOOD    EKG None  Radiology Ct Abdomen Pelvis Wo Contrast  Result Date: 09/25/2018 CLINICAL DATA:  Abdominal pain, nausea EXAM: CT ABDOMEN AND PELVIS WITHOUT CONTRAST TECHNIQUE: Multidetector CT imaging of the abdomen and pelvis was performed following the standard protocol without IV contrast. COMPARISON:  10/18/2008 FINDINGS: Lower chest: Small hiatal hernia. Lung bases are clear. No effusions. Heart is normal size.  Hepatobiliary: No focal liver abnormality is seen. Status post cholecystectomy. No biliary dilatation. Pancreas: No focal abnormality or ductal dilatation. Spleen: No focal abnormality.  Normal size. Adrenals/Urinary Tract: Low-density area within the midpole of the right kidney shown on prior study to represent a cyst. This measures 1.3 cm. No hydronephrosis. No renal or ureteral stones. Urinary bladder is unremarkable. Stomach/Bowel: Normal appendix. Stomach, large and small bowel grossly unremarkable. Vascular/Lymphatic: No evidence of aneurysm or adenopathy. Reproductive: No visible focal abnormality. Other: No free fluid or free air. Musculoskeletal: No acute bony abnormality. IMPRESSION: Small hiatal hernia. Prior cholecystectomy. No renal or ureteral stones.  No hydronephrosis. Normal appendix. No visible acute findings in the abdomen or pelvis. Electronically Signed   By: Charlett Nose M.D.   On: 09/25/2018 20:17    Procedures Procedures (including critical care time)  Medications Ordered in ED Medications  iohexol (OMNIPAQUE) 300 MG/ML solution 30 mL (has no administration in time range)  acetaminophen (TYLENOL) tablet 650 mg (has no administration in time range)  cefTRIAXone (ROCEPHIN) 1 g in sodium chloride 0.9 % 100 mL IVPB (has no administration in time range)  sodium chloride 0.9 % bolus 1,000 mL (0 mLs Intravenous Stopped 09/25/18 1904)  ondansetron (ZOFRAN) injection 4 mg (4 mg Intravenous Given 09/25/18 1724)     Initial Impression / Assessment and Plan / ED Course  I have reviewed the triage vital signs and the nursing notes.  Pertinent labs & imaging results that were available during my care of the patient were reviewed by me and considered in my medical decision making (see chart for details).  Clinical Course as of Sep 25 2129  Wynelle Link Sep 25, 2018  [id number] 57 year old male with history of HIV, recent diagnosis of Shigella positive diarrhea presents with complaint of vomiting,  diarrhea, abdominal cramping.  Patient completed course of Cipro, states that his symptoms returned immediately after finishing his antibiotics.  Reports onset of fever with temp of 103 at home today.  On exam patient has mild generalized abdominal tenderness.  CT scan with p.o. contrast is unremarkable.  CMP with elevated creatinine at 1.98 compared to most recent on record with [facility] from August 18, 2018 shows creatinine of 1.2.  CBC with leukocytosis of 12.9.  Lipase within normal limits urinalysis with ketones and protein, small bilirubin.  While in the ER patient did have a temperature of 102.5, was given Tylenol.  He was given IV fluids for his acute kidney injury.  Review of records from his [facility] infectious disease provider recommends Rocephin or cefixime if patient's symptoms were to return due to resistance to Cipro.  Labs on August 18, 2017 show ACD4 623, HIVRS not detected. Discussed results with Dr. Rosalia Hammers, ER attending, recommends admission to the hospital for acute kidney injury, Shigella infection in any compress patient.  Discussed results and plan of care with patient who is agreeable.  Hospitalist paged for consult.   [LM]  2129 Case discussed with Dr. Emmit Pomfret, [facility] who will see the patient.    [LM]    Clinical Course User Index [LM] Jeannie Fend, PA-C   Final Clinical Impressions(s) / ED Diagnoses   Final diagnoses:  Acute kidney injury (HCC)  HIV infection, unspecified symptom status (HCC)  Vomiting and diarrhea    ED Discharge Orders    None       Alden Hipp 09/25/18 2130    Margarita Grizzle, MD 09/26/18 1714

## 2018-09-25 NOTE — ED Triage Notes (Signed)
Pt reports onset of vomiting and diarrhea on Friday night. Generalized abdominal pain, 7/10. Fever this morning 102.0.

## 2018-09-25 NOTE — H&P (Signed)
History and Physical   [facility] [place] [facility] [place] [facility] Admission History and Physical [employer], D.O.    Patient Name: Trevor Collins MR#: [id number] Date of Birth: 04/28/1961 Date of Admission: 09/25/2018  Referring MD/NP/PA: Army Melia Primary Care Physician: Sterling Big, MD  Chief Complaint:  Chief Complaint  Patient presents with  . Emesis    HPI: Trevor Collins is a 57 y.o. male with a known history of HIV with most recent CD4 count in the 600s presents to the emergency department for evaluation of abdominal pain and vomiting.  Patient was recently seen and treated with Cipro twice daily for 7 days for Shigella positive stool.  At the conclusion of his course of antibiotics on October 15 his symptoms returned.  He presents to the emergency department today with fever to 103, multiple episodes of loose stools, nonbloody nonbilious vomiting and diffuse abdominal cramping.  Patient denies fevers/chills, weakness, dizziness, chest pain, shortness of breath, dysuria/frequency, changes in mental status.    Otherwise there has been no change in status. Patient has been taking medication as prescribed and there has been no recent change in medication or diet.  No recent antibiotics.  There has been no recent illness, hospitalizations, travel or sick contacts.    EMS/ED Course: Patient received Rocephin, Tylenol, Zofran and IV fluids. Medical admission has been requested for further management of sepsis secondary to infectious diarrhea in immunocompromised patient, acute kidney injury.  Review of Systems:  CONSTITUTIONAL: Positive fever.  No chills, fatigue, weakness, weight gain/loss, headache. EYES: No blurry or double vision. ENT: No tinnitus, postnasal drip, redness or soreness of the oropharynx. RESPIRATORY: No cough, dyspnea, wheeze.  No hemoptysis.  CARDIOVASCULAR: No chest pain, palpitations, syncope, orthopnea. No lower extremity edema.   GASTROINTESTINAL: Active nausea, vomiting, abdominal pain, diarrhea, negative constipation.  No hematemesis, melena or hematochezia. GENITOURINARY: No dysuria, frequency, hematuria. ENDOCRINE: No polyuria or nocturia. No heat or cold intolerance. HEMATOLOGY: No anemia, bruising, bleeding. INTEGUMENTARY: No rashes, ulcers, lesions. MUSCULOSKELETAL: No arthritis, gout, dyspnea. NEUROLOGIC: No numbness, tingling, ataxia, seizure-type activity, weakness. PSYCHIATRIC: No anxiety, depression, insomnia.   Past Medical History:  Diagnosis Date  . Gonorrhea   . HIV positive (HCC)     Past Surgical History:  Procedure Laterality Date  . CHOLECYSTECTOMY       reports that he has been smoking. He does not have any smokeless tobacco history on file. He reports that he does not drink alcohol or use drugs.  Allergies  Allergen Reactions  . Morphine And Related Shortness Of Breath    Family History  Problem Relation Age of Onset  . Hypertension Other   . Diabetes Other   . Cancer Other     Prior to Admission medications   Medication Sig Start Date End Date Taking? Authorizing Provider  Doxylamine Succinate, Sleep, (SLEEP AID PO) Take 1 tablet by mouth at bedtime.   Yes [provider]  GENVOYA 150-150-200-10 MG TABS tablet Take 1 tablet by mouth daily with breakfast.  09/01/18  Yes [provider]  ranitidine (ZANTAC) 150 MG capsule Take 150 mg by mouth daily.   Yes [provider]  ibuprofen (ADVIL,MOTRIN) 600 MG tablet Take 1 tablet (600 mg total) by mouth every 6 (six) hours as needed. Patient not taking: Reported on 09/25/2018 11/03/13   Jaynie Crumble, PA-C  meloxicam (MOBIC) 7.5 MG tablet Take 2 tablets (15 mg total) by mouth daily. Patient not taking: Reported on 09/25/2018 10/08/14   Jobe Gibbon,  Tresa Endo, PA-C  methocarbamol (ROBAXIN) 500 MG tablet Take 1 tablet (500 mg total) by mouth 2 (two) times daily. Patient not taking: Reported on 09/25/2018 10/08/14    Antony Madura, PA-C  oxyCODONE-acetaminophen (PERCOCET/ROXICET) 5-325 MG per tablet Take 1-2 tablets by mouth every 6 (six) hours as needed for moderate pain or severe pain. Patient not taking: Reported on 09/25/2018 10/08/14   Antony Madura, PA-C    Physical Exam: Vitals:   09/25/18 1632 09/25/18 1726 09/25/18 1830 09/25/18 2017  BP: 101/81 111/71 113/78 100/61  Pulse: (!) 123 (!) 104 95 (!) 103  Resp: 18 16 16 20   Temp: 99.5 F (37.5 C)   (!) 102.5 F (39.2 C)  TempSrc: Oral   Oral  SpO2: 96% 96% 96% 98%  Weight:    77.1 kg  Height:    5\' 11"  (1.803 m)    GENERAL: 57 y.o.-year-old male patient, well-developed, well-nourished lying in the bed in no acute distress.  Pleasant and cooperative.   HEENT: Head atraumatic, normocephalic. Pupils equal. Mucus membranes moist. NECK: Supple. No JVD. CHEST: Normal breath sounds bilaterally. No wheezing, rales, rhonchi or crackles. No use of accessory muscles of respiration.  No reproducible chest wall tenderness.  CARDIOVASCULAR: S1, S2 normal. No murmurs, rubs, or gallops. Cap refill <2 seconds. Pulses intact distally.  ABDOMEN: Soft, nondistended, diffuse tenderness.  No rebound, guarding, rigidity. Normoactive bowel sounds present in all four quadrants.  EXTREMITIES: No pedal edema, cyanosis, or clubbing. No calf tenderness or Homan's sign.  NEUROLOGIC: The patient is alert and oriented x 3. Cranial nerves II through XII are grossly intact with no focal sensorimotor deficit. PSYCHIATRIC:  Normal affect, mood, thought content. SKIN: Warm, dry, and intact without obvious rash, lesion, or ulcer.    Labs on Admission:  CBC: Recent Labs  Lab 09/25/18 1653  WBC 12.9*  HGB 14.8  HCT 45.3  MCV 83.0  PLT 254   Basic Metabolic Panel: Recent Labs  Lab 09/25/18 1653  NA 138  K 3.4*  CL 100  CO2 24  GLUCOSE 124*  BUN 12  CREATININE 1.98*  CALCIUM 9.1   GFR: Estimated Creatinine Clearance: 44.9 mL/min (A) (by C-G formula based on  SCr of 1.98 mg/dL (H)). Liver Function Tests: Recent Labs  Lab 09/25/18 1653  AST 29  ALT 24  ALKPHOS 64  BILITOT 1.1  PROT 7.9  ALBUMIN 4.2   Recent Labs  Lab 09/25/18 1653  LIPASE 24   No results for input(s): AMMONIA in the last 168 hours. Coagulation Profile: No results for input(s): INR, PROTIME in the last 168 hours. Cardiac Enzymes: No results for input(s): CKTOTAL, CKMB, CKMBINDEX, TROPONINI in the last 168 hours. BNP (last 3 results) No results for input(s): PROBNP in the last 8760 hours. HbA1C: No results for input(s): HGBA1C in the last 72 hours. CBG: No results for input(s): GLUCAP in the last 168 hours. Lipid Profile: No results for input(s): CHOL, HDL, LDLCALC, TRIG, CHOLHDL, LDLDIRECT in the last 72 hours. Thyroid Function Tests: No results for input(s): TSH, T4TOTAL, FREET4, T3FREE, THYROIDAB in the last 72 hours. Anemia Panel: No results for input(s): VITAMINB12, FOLATE, FERRITIN, TIBC, IRON, RETICCTPCT in the last 72 hours. Urine analysis:    Component Value Date/Time   COLORURINE AMBER (A) 09/25/2018 1653   APPEARANCEUR CLOUDY (A) 09/25/2018 1653   LABSPEC 1.026 09/25/2018 1653   PHURINE 5.0 09/25/2018 1653   GLUCOSEU NEGATIVE 09/25/2018 1653   HGBUR NEGATIVE 09/25/2018 1653   BILIRUBINUR SMALL (A) 09/25/2018  1653   BILIRUBINUR negative 12/04/2012 1446   KETONESUR 5 (A) 09/25/2018 1653   PROTEINUR 100 (A) 09/25/2018 1653   UROBILINOGEN 1.0 12/04/2012 1446   UROBILINOGEN 1.0 08/15/2008 1606   NITRITE NEGATIVE 09/25/2018 1653   LEUKOCYTESUR NEGATIVE 09/25/2018 1653   Sepsis Labs: @LABRCNTIP (procalcitonin:4,lacticidven:4) )No results found for this or any previous visit (from the past 240 hour(s)).   Radiological Exams on Admission: Ct Abdomen Pelvis Wo Contrast  Result Date: 09/25/2018 CLINICAL DATA:  Abdominal pain, nausea EXAM: CT ABDOMEN AND PELVIS WITHOUT CONTRAST TECHNIQUE: Multidetector CT imaging of the abdomen and pelvis was  performed following the standard protocol without IV contrast. COMPARISON:  10/18/2008 FINDINGS: Lower chest: Small hiatal hernia. Lung bases are clear. No effusions. Heart is normal size. Hepatobiliary: No focal liver abnormality is seen. Status post cholecystectomy. No biliary dilatation. Pancreas: No focal abnormality or ductal dilatation. Spleen: No focal abnormality.  Normal size. Adrenals/Urinary Tract: Low-density area within the midpole of the right kidney shown on prior study to represent a cyst. This measures 1.3 cm. No hydronephrosis. No renal or ureteral stones. Urinary bladder is unremarkable. Stomach/Bowel: Normal appendix. Stomach, large and small bowel grossly unremarkable. Vascular/Lymphatic: No evidence of aneurysm or adenopathy. Reproductive: No visible focal abnormality. Other: No free fluid or free air. Musculoskeletal: No acute bony abnormality. IMPRESSION: Small hiatal hernia. Prior cholecystectomy. No renal or ureteral stones.  No hydronephrosis. Normal appendix. No visible acute findings in the abdomen or pelvis. Electronically Signed   By: Charlett Nose M.D.   On: 09/25/2018 20:17    Assessment/Plan  This is a 57 y.o. male with a history of HIV now being admitted with:  #. Sepsis likely secondary to infectious diarrhea - Admit to inpatient with telemetry monitoring - IV antibiotics: Rocephin - IV fluid hydration - Follow up stool cultures and C. difficile - Repeat CBC in am.  - Infectious disease consultation has been requested   #. Acute kidney injury likely secondary to dehydration - IV fluids and repeat BMP in AM.  - Avoid nephrotoxic medications  #.  Hypokalemia -.  Replace p.o.   #.  HIV Continue Genvoya  Admission status: Inpatient IV Fluids: Normal saline Diet/Nutrition: Clear liquids Consults called: We will consult ID in a.m. DVT Px: SCDs and early ambulation. Code Status: Full Code  Disposition Plan: To home in 1-2 days  All the records are  reviewed and case discussed with ED provider. Management plans discussed with the patient and/or family who express understanding and agree with plan of care.  Fabrice Dyal D.O. on 09/25/2018 at 9:31 PM CC: Primary care physician; Sterling Big, MD   09/25/2018, 9:31 PM

## 2018-09-25 NOTE — ED Notes (Signed)
ED TO INPATIENT HANDOFF REPORT  Name/Age/Gender Trevor Collins 57 y.o. male  Code Status   Home/SNF/Other Home  Chief Complaint n/v/d  Level of Care/Admitting Diagnosis ED Disposition    ED Disposition Condition [facility] Area: [place] [[id number]]  Level of Care: Med-Surg [16]  Diagnosis: Infectious diarrhea [[id number]]  Admitting Physician: Harvie Bridge [[id number]]  Attending Physician: Harvie Bridge [[id number]]  Estimated length of stay: past midnight tomorrow  Certification:: I certify this patient will need inpatient services for at least 2 midnights  PT Class (Do Not Modify): Inpatient [101]  PT Acc Code (Do Not Modify): Private [1]       Medical History Past Medical History:  Diagnosis Date  . Gonorrhea   . HIV positive (HCC)     Allergies Allergies  Allergen Reactions  . Morphine And Related Shortness Of Breath    IV Location/Drains/Wounds Patient Lines/Drains/Airways Status   Active Line/Drains/Airways    Name:   Placement date:   Placement time:   Site:   Days:   Peripheral IV 09/25/18 Left Antecubital   09/25/18    1658    Antecubital   less than 1          Labs/Imaging Results for orders placed or performed during the hospital encounter of 09/25/18 (from the past 48 hour(s))  Lipase, blood     Status: None   Collection Time: 09/25/18  4:53 PM  Result Value Ref Range   Lipase 24 11 - 51 U/L    Comment: Performed at [facility], [place] [address], [place], [place] [address]  Comprehensive metabolic panel     Status: Abnormal   Collection Time: 09/25/18  4:53 PM  Result Value Ref Range   Sodium 138 135 - 145 mmol/L   Potassium 3.4 (L) 3.5 - 5.1 mmol/L   Chloride 100 98 - 111 mmol/L   CO2 24 22 - 32 mmol/L   Glucose, Bld 124 (H) 70 - 99 mg/dL   BUN 12 6 - 20 mg/dL   Creatinine, Ser 1.98 (H) 0.61 - 1.24 mg/dL   Calcium 9.1 8.9 - 10.3 mg/dL   Total Protein 7.9 6.5 - 8.1 g/dL   Albumin 4.2 3.5 - 5.0 g/dL   AST 29 15 - 41 U/L   ALT 24 0 - 44 U/L   Alkaline Phosphatase 64 38 - 126 U/L   Total Bilirubin 1.1 0.3 - 1.2 mg/dL   GFR calc non Af Amer 36 (L) >60 mL/min   GFR calc Af Amer 42 (L) >60 mL/min    Comment: (NOTE) The eGFR has been calculated using the CKD EPI equation. This calculation has not been validated in all clinical situations. eGFR's persistently <60 mL/min signify possible Chronic Kidney Disease.    Anion gap 14 5 - 15    Comment: Performed at [facility], [place] [address] [place], [place] [address]  CBC     Status: Abnormal   Collection Time: 09/25/18  4:53 PM  Result Value Ref Range   WBC 12.9 (H) 4.0 - 10.5 K/uL   RBC 5.46 4.22 - 5.81 MIL/uL   Hemoglobin 14.8 13.0 - 17.0 g/dL   HCT 45.3 39.0 - 52.0 %   MCV 83.0 80.0 - 100.0 fL   MCH 27.1 26.0 - 34.0 pg   MCHC 32.7 30.0 - 36.0 g/dL   RDW 13.9 11.5 - 15.5 %   Platelets 254 150 - 400 K/uL   nRBC  0.0 0.0 - 0.2 %    Comment: Performed at [facility], [place] [address], [place], [place] [address]  Urinalysis, Routine w reflex microscopic     Status: Abnormal   Collection Time: 09/25/18  4:53 PM  Result Value Ref Range   Color, Urine AMBER (A) YELLOW    Comment: BIOCHEMICALS MAY BE AFFECTED BY COLOR   APPearance CLOUDY (A) CLEAR   Specific Gravity, Urine 1.026 1.005 - 1.030   pH 5.0 5.0 - 8.0   Glucose, UA NEGATIVE NEGATIVE mg/dL   Hgb urine dipstick NEGATIVE NEGATIVE   Bilirubin Urine SMALL (A) NEGATIVE   Ketones, ur 5 (A) NEGATIVE mg/dL   Protein, ur 100 (A) NEGATIVE mg/dL   Nitrite NEGATIVE NEGATIVE   Leukocytes, UA NEGATIVE NEGATIVE   RBC / HPF 0-5 0 - 5 RBC/hpf   WBC, UA 6-10 0 - 5 WBC/hpf   Bacteria, UA MANY (A) NONE SEEN   Squamous Epithelial / LPF 0-5 0 - 5   Mucus PRESENT    Hyaline Casts, UA PRESENT    Amorphous Crystal PRESENT     Comment: Performed at [facility], [place] [address] [place], [place] [address]   Ct  Abdomen Pelvis Wo Contrast  Result Date: 09/25/2018 CLINICAL DATA:  Abdominal pain, nausea EXAM: CT ABDOMEN AND PELVIS WITHOUT CONTRAST TECHNIQUE: Multidetector CT imaging of the abdomen and pelvis was performed following the standard protocol without IV contrast. COMPARISON:  10/18/2008 FINDINGS: Lower chest: Small hiatal hernia. Lung bases are clear. No effusions. Heart is normal size. Hepatobiliary: No focal liver abnormality is seen. Status post cholecystectomy. No biliary dilatation. Pancreas: No focal abnormality or ductal dilatation. Spleen: No focal abnormality.  Normal size. Adrenals/Urinary Tract: Low-density area within the midpole of the right kidney shown on prior study to represent a cyst. This measures 1.3 cm. No hydronephrosis. No renal or ureteral stones. Urinary bladder is unremarkable. Stomach/Bowel: Normal appendix. Stomach, large and small bowel grossly unremarkable. Vascular/Lymphatic: No evidence of aneurysm or adenopathy. Reproductive: No visible focal abnormality. Other: No free fluid or free air. Musculoskeletal: No acute bony abnormality. IMPRESSION: Small hiatal hernia. Prior cholecystectomy. No renal or ureteral stones.  No hydronephrosis. Normal appendix. No visible acute findings in the abdomen or pelvis. Electronically Signed   By: Rolm Baptise M.D.   On: 09/25/2018 20:17   None  Pending Labs Unresulted Labs (From admission, onward)    Start     Ordered   09/25/18 2035  C difficile quick scan w PCR reflex  (C Difficile quick screen w PCR reflex panel)  Once, for 24 hours,   R     09/25/18 2034   09/25/18 2034  Gastrointestinal Panel by PCR , Stool  (Gastrointestinal Panel by PCR, Stool)  Once,   R     09/25/18 2034   Signed and Held  Magnesium  Add-on,   R     Signed and Held   Signed and Held  Phosphorus  Add-on,   R     Signed and Held   Signed and Held  CBC  Tomorrow morning,   R     Signed and Held   Signed and Held  Stool culture (children & immunocomp  patients)  Once,   R     Signed and Held   Signed and Held  C difficile quick scan w PCR reflex  (C Difficile quick screen w PCR reflex panel)  Once, for 24 hours,   R  Signed and Held   Signed and Held  Comprehensive metabolic panel  Tomorrow morning,   R     Signed and Held          Vitals/Pain Today's Vitals   09/25/18 1830 09/25/18 2017 09/25/18 2239 09/25/18 2251  BP: 113/78 100/61 114/72   Pulse: 95 (!) 103 93   Resp: _0 Temp:  (!) 102.5 F (39.2 C) 100.1 F (37.8 C)   TempSrc:  Oral Oral   SpO2: 96% 98% 97%   Weight:  77.1 kg    Height:  _1  (1.803 m)    PainSc:  6   4     Isolation Precautions Enteric precautions (UV disinfection)  Medications Medications  iohexol (OMNIPAQUE) 300 MG/ML solution 30 mL (has no administration in time range)  sodium chloride 0.9 % bolus 1,000 mL (0 mLs Intravenous Stopped 09/25/18 1904)  ondansetron (ZOFRAN) injection 4 mg (4 mg Intravenous Given 09/25/18 1724)  acetaminophen (TYLENOL) tablet 650 mg (650 mg Oral Given 09/25/18 2045)  cefTRIAXone (ROCEPHIN) 1 g in sodium chloride 0.9 % 100 mL IVPB (0 g Intravenous Stopped 09/25/18 2340)    Mobility walks

## 2018-09-25 NOTE — ED Notes (Signed)
ED Provider at bedside. 

## 2018-09-25 NOTE — ED Notes (Signed)
Called to give report. Waiting on a nurse to come take this patient.

## 2018-09-26 ENCOUNTER — Encounter (HOSPITAL_COMMUNITY): Payer: Self-pay | Admitting: Internal Medicine

## 2018-09-26 DIAGNOSIS — F17200 Nicotine dependence, unspecified, uncomplicated: Secondary | ICD-10-CM

## 2018-09-26 DIAGNOSIS — E876 Hypokalemia: Secondary | ICD-10-CM | POA: Diagnosis present

## 2018-09-26 DIAGNOSIS — Z21 Asymptomatic human immunodeficiency virus [HIV] infection status: Secondary | ICD-10-CM | POA: Diagnosis present

## 2018-09-26 DIAGNOSIS — B20 Human immunodeficiency virus [HIV] disease: Secondary | ICD-10-CM

## 2018-09-26 DIAGNOSIS — A039 Shigellosis, unspecified: Principal | ICD-10-CM

## 2018-09-26 DIAGNOSIS — N179 Acute kidney failure, unspecified: Secondary | ICD-10-CM | POA: Diagnosis present

## 2018-09-26 HISTORY — DX: Human immunodeficiency virus (HIV) disease: B20

## 2018-09-26 HISTORY — DX: Asymptomatic human immunodeficiency virus (hiv) infection status: Z21

## 2018-09-26 LAB — COMPREHENSIVE METABOLIC PANEL
ALBUMIN: 3.7 g/dL (ref 3.5–5.0)
ALT: 20 U/L (ref 0–44)
AST: 20 U/L (ref 15–41)
Alkaline Phosphatase: 59 U/L (ref 38–126)
Anion gap: 11 (ref 5–15)
BUN: 14 mg/dL (ref 6–20)
CALCIUM: 8.3 mg/dL — AB (ref 8.9–10.3)
CHLORIDE: 105 mmol/L (ref 98–111)
CO2: 21 mmol/L — ABNORMAL LOW (ref 22–32)
CREATININE: 1.58 mg/dL — AB (ref 0.61–1.24)
GFR calc Af Amer: 55 mL/min — ABNORMAL LOW (ref >60)
GFR, EST NON AFRICAN AMERICAN: 48 mL/min — AB (ref >60)
Glucose, Bld: 100 mg/dL — ABNORMAL HIGH (ref 70–99)
POTASSIUM: 2.8 mmol/L — AB (ref 3.5–5.1)
SODIUM: 137 mmol/L (ref 135–145)
TOTAL PROTEIN: 6.9 g/dL (ref 6.5–8.1)
Total Bilirubin: 0.8 mg/dL (ref 0.3–1.2)

## 2018-09-26 LAB — GASTROINTESTINAL PANEL BY PCR, STOOL (REPLACES STOOL CULTURE)
ADENOVIRUS F40/41: NOT DETECTED
Astrovirus: NOT DETECTED
Campylobacter species: NOT DETECTED
Cryptosporidium: NOT DETECTED
Cyclospora cayetanensis: NOT DETECTED
ENTAMOEBA HISTOLYTICA: NOT DETECTED
ENTEROPATHOGENIC E COLI (EPEC): NOT DETECTED
Enteroaggregative E coli (EAEC): NOT DETECTED
Enterotoxigenic E coli (ETEC): NOT DETECTED
Giardia lamblia: NOT DETECTED
Norovirus GI/GII: NOT DETECTED
Plesimonas shigelloides: NOT DETECTED
Rotavirus A: NOT DETECTED
SALMONELLA SPECIES: NOT DETECTED
Sapovirus (I, II, IV, and V): NOT DETECTED
Shiga like toxin producing E coli (STEC): NOT DETECTED
Shigella/Enteroinvasive E coli (EIEC): DETECTED — AB
VIBRIO CHOLERAE: NOT DETECTED
Vibrio species: NOT DETECTED
YERSINIA ENTEROCOLITICA: NOT DETECTED

## 2018-09-26 LAB — PHOSPHORUS: PHOSPHORUS: 3.2 mg/dL (ref 2.5–4.6)

## 2018-09-26 LAB — BASIC METABOLIC PANEL
ANION GAP: 6 (ref 5–15)
BUN: 12 mg/dL (ref 6–20)
CO2: 25 mmol/L (ref 22–32)
CREATININE: 1.26 mg/dL — AB (ref 0.61–1.24)
Calcium: 8 mg/dL — ABNORMAL LOW (ref 8.9–10.3)
Chloride: 107 mmol/L (ref 98–111)
GLUCOSE: 111 mg/dL — AB (ref 70–99)
Potassium: 3.6 mmol/L (ref 3.5–5.1)
Sodium: 138 mmol/L (ref 135–145)

## 2018-09-26 LAB — CBC
HCT: 40.6 % (ref 39.0–52.0)
HEMOGLOBIN: 13.4 g/dL (ref 13.0–17.0)
MCH: 27 pg (ref 26.0–34.0)
MCHC: 33 g/dL (ref 30.0–36.0)
MCV: 81.9 fL (ref 80.0–100.0)
PLATELETS: 245 10*3/uL (ref 150–400)
RBC: 4.96 MIL/uL (ref 4.22–5.81)
RDW: 13.8 % (ref 11.5–15.5)
WBC: 8.3 10*3/uL (ref 4.0–10.5)
nRBC: 0 % (ref 0.0–0.2)

## 2018-09-26 LAB — C DIFFICILE QUICK SCREEN W PCR REFLEX
C DIFFICILE (CDIFF) TOXIN: NEGATIVE
C DIFFICLE (CDIFF) ANTIGEN: NEGATIVE
C Diff antigen: NEGATIVE
C Diff interpretation: NOT DETECTED
C Diff interpretation: NOT DETECTED
C Diff toxin: NEGATIVE

## 2018-09-26 LAB — MAGNESIUM: Magnesium: 1.8 mg/dL (ref 1.7–2.4)

## 2018-09-26 MED ORDER — SODIUM CHLORIDE 0.9 % IV SOLN
INTRAVENOUS | Status: DC
  Administered 2018-09-26 – 2018-09-27 (×6): via INTRAVENOUS

## 2018-09-26 MED ORDER — MAGNESIUM SULFATE 4 GM/100ML IV SOLN
4.0000 g | Freq: Once | INTRAVENOUS | Status: AC
  Administered 2018-09-26: 4 g via INTRAVENOUS
  Filled 2018-09-26: qty 100

## 2018-09-26 MED ORDER — POTASSIUM CHLORIDE CRYS ER 20 MEQ PO TBCR
40.0000 meq | EXTENDED_RELEASE_TABLET | ORAL | Status: AC
  Administered 2018-09-26 (×2): 40 meq via ORAL
  Filled 2018-09-26 (×2): qty 2

## 2018-09-26 MED ORDER — ELVITEG-COBIC-EMTRICIT-TENOFAF 150-150-200-10 MG PO TABS
1.0000 | ORAL_TABLET | Freq: Every day | ORAL | Status: DC
  Administered 2018-09-26 – 2018-09-29 (×4): 1 via ORAL
  Filled 2018-09-26 (×4): qty 1

## 2018-09-26 MED ORDER — FAMOTIDINE 20 MG PO TABS
20.0000 mg | ORAL_TABLET | Freq: Two times a day (BID) | ORAL | Status: DC
  Administered 2018-09-26 – 2018-09-29 (×8): 20 mg via ORAL
  Filled 2018-09-26 (×13): qty 1

## 2018-09-26 MED ORDER — SODIUM CHLORIDE 0.9 % IV SOLN
2.0000 g | Freq: Every day | INTRAVENOUS | Status: DC
  Administered 2018-09-26 – 2018-09-29 (×4): 2 g via INTRAVENOUS
  Filled 2018-09-26 (×4): qty 2

## 2018-09-26 MED ORDER — POTASSIUM CHLORIDE CRYS ER 20 MEQ PO TBCR
20.0000 meq | EXTENDED_RELEASE_TABLET | Freq: Two times a day (BID) | ORAL | Status: DC
  Administered 2018-09-26: 20 meq via ORAL
  Filled 2018-09-26: qty 1

## 2018-09-26 MED ORDER — ONDANSETRON HCL 4 MG PO TABS: 4.0000 mg | ORAL_TABLET | Freq: Four times a day (QID) | ORAL | Status: DC | PRN

## 2018-09-26 MED ORDER — DICYCLOMINE HCL 20 MG PO TABS
20.0000 mg | ORAL_TABLET | Freq: Three times a day (TID) | ORAL | Status: DC
  Administered 2018-09-26 – 2018-09-29 (×10): 20 mg via ORAL
  Filled 2018-09-26 (×10): qty 1

## 2018-09-26 MED ORDER — IPRATROPIUM-ALBUTEROL 0.5-2.5 (3) MG/3ML IN SOLN: 3.0000 mL | Freq: Four times a day (QID) | RESPIRATORY_TRACT | Status: DC | PRN

## 2018-09-26 MED ORDER — POTASSIUM CHLORIDE CRYS ER 20 MEQ PO TBCR
40.0000 meq | EXTENDED_RELEASE_TABLET | Freq: Once | ORAL | Status: AC
  Administered 2018-09-26: 40 meq via ORAL
  Filled 2018-09-26: qty 2

## 2018-09-26 MED ORDER — ONDANSETRON HCL 4 MG/2ML IJ SOLN: 4.0000 mg | Freq: Four times a day (QID) | INTRAMUSCULAR | Status: DC | PRN

## 2018-09-26 MED ORDER — ACETAMINOPHEN 325 MG PO TABS: 650.0000 mg | ORAL_TABLET | Freq: Four times a day (QID) | ORAL | Status: DC | PRN

## 2018-09-26 MED ORDER — ZOLPIDEM TARTRATE 5 MG PO TABS
5.0000 mg | ORAL_TABLET | Freq: Every evening | ORAL | Status: DC | PRN
  Administered 2018-09-27: 5 mg via ORAL
  Filled 2018-09-26: qty 1

## 2018-09-26 MED ORDER — ACETAMINOPHEN 650 MG RE SUPP: 650.0000 mg | Freq: Four times a day (QID) | RECTAL | Status: DC | PRN

## 2018-09-26 MED ORDER — IPRATROPIUM-ALBUTEROL 0.5-2.5 (3) MG/3ML IN SOLN: 3.0000 mL | Freq: Four times a day (QID) | RESPIRATORY_TRACT | Status: DC

## 2018-09-26 NOTE — Evaluation (Signed)
Physical Therapy Evaluation Patient Details Name: DYSEN EDMONDSON MRN: [id number] DOB: 1961-06-26 Today's Date: 09/26/2018   History of Present Illness  Obinna Ehresman is a 57 y.o. male with a known history of HIV with most recent CD4 count in the 600s presents to the emergency department for evaluation of abdominal pain and vomiting.   Clinical Impression  Patient presents close to functional baseline.  Relates some generalized weakness and encouraged hallway ambulation, up in chair for meals when able to eat and gradual return to activities once discharged.  No further skilled PT needed.  Will sign off.     Follow Up Recommendations No PT follow up    Equipment Recommendations  None recommended by PT    Recommendations for Other Services       Precautions / Restrictions Precautions Precautions: None      Mobility  Bed Mobility Overal bed mobility: Independent                Transfers Overall transfer level: Independent                  Ambulation/Gait Ambulation/Gait assistance: Independent Gait Distance (Feet): 200 Feet Assistive device: None Gait Pattern/deviations: Step-through pattern;WFL(Within Functional Limits)        Stairs Stairs: Yes Stairs assistance: Modified independent (Device/Increase time);Independent Stair Management: No rails;One rail Right;Alternating pattern;Forwards;Step to pattern Number of Stairs: 3 General stair comments: ascended with rail, descended without rail with step to pattern  Wheelchair Mobility    Modified Rankin (Stroke Patients Only)       Balance Overall balance assessment: Independent                               Standardized Balance Assessment Standardized Balance Assessment : Dynamic Gait Index   Dynamic Gait Index Level Surface: Normal Change in Gait Speed: Normal Gait with Horizontal Head Turns: Normal Gait with Vertical Head Turns: Normal Gait and Pivot Turn: Normal Step Over  Obstacle: Normal Step Around Obstacles: Normal Steps: Mild Impairment Total Score: 23       Pertinent Vitals/Pain Pain Assessment: No/denies pain    Home Living Family/patient expects to be discharged to:: Private residence Living Arrangements: Alone Available Help at Discharge: Family;Available PRN/intermittently(2 sons and 2 sisters) Type of Home: Apartment Home Access: Stairs to enter Entrance Stairs-Rails: Right Entrance Stairs-Number of Steps: flight Home Layout: One level Home Equipment: None      Prior Function Level of Independence: Independent         Comments: works as Nature conservation officer at [employer]   Dominant Hand: Right    Extremity/Trunk Assessment   Upper Extremity Assessment Upper Extremity Assessment: Overall WFL for tasks assessed    Lower Extremity Assessment Lower Extremity Assessment: Overall WFL for tasks assessed       Communication   Communication: No difficulties  Cognition Arousal/Alertness: Awake/alert Behavior During Therapy: WFL for tasks assessed/performed Overall Cognitive Status: Within Functional Limits for tasks assessed                                        General Comments General comments (skin integrity, edema, etc.): Patient educated to ambulate in hallway during remainder of hospitalization, to sit up for meals when able to eat and discussed energy conservation tips for when back home with intermittent assist.  Exercises     Assessment/Plan    PT Assessment Patent does not need any further PT services  PT Problem List         PT Treatment Interventions      PT Goals (Current goals can be found in the Care Plan section)  Acute Rehab PT Goals PT Goal Formulation: All assessment and education complete, DC therapy    Frequency     Barriers to discharge        Co-evaluation               AM-PAC PT "6 Clicks" Daily Activity  Outcome Measure Difficulty turning  over in bed (including adjusting bedclothes, sheets and blankets)?: None Difficulty moving from lying on back to sitting on the side of the bed? : None Difficulty sitting down on and standing up from a chair with arms (e.g., wheelchair, bedside commode, etc,.)?: None Help needed moving to and from a bed to chair (including a wheelchair)?: None Help needed walking in hospital room?: None Help needed climbing 3-5 steps with a railing? : A Little 6 Click Score: 23    End of Session Equipment Utilized During Treatment: Gait belt Activity Tolerance: Patient tolerated treatment well Patient left: in bed;with call bell/phone within reach   PT Visit Diagnosis: Muscle weakness (generalized) (M62.81)    Time: 2841-3244 PT Time Calculation (min) (ACUTE ONLY): 19 min   Charges:   PT Evaluation $PT Eval Low Complexity: 1 Low          Sheran Lawless, [facility] [phone] 09/26/2018   Elray Mcgregor 09/26/2018, 9:59 AM

## 2018-09-26 NOTE — Progress Notes (Addendum)
PROGRESS NOTE    Trevor Collins  RUE:[id number] DOB: 1961-06-30 DOA: 09/25/2018 PCP: Sterling Big, MD    Brief Narrative:  Trevor Collins is a 57 y.o. male with a known history of HIV with most recent CD4 count in the 600s presents to the emergency department for evaluation of abdominal pain and vomiting.  Patient was recently seen and treated with Cipro twice daily for 7 days for Shigella positive stool.  At the conclusion of his course of antibiotics on October 15 his symptoms returned.  He presents to the emergency department today with fever to 103, multiple episodes of loose stools, nonbloody nonbilious vomiting and diffuse abdominal cramping.  Patient denies fevers/chills, weakness, dizziness, chest pain, shortness of breath, dysuria/frequency, changes in mental status.    Otherwise there has been no change in status. Patient has been taking medication as prescribed and there has been no recent change in medication or diet.  No recent antibiotics.  There has been no recent illness, hospitalizations, travel or sick contacts.    EMS/ED Course: Patient received Rocephin, Tylenol, Zofran and IV fluids. Medical admission has been requested for further management of sepsis secondary to infectious diarrhea in immunocompromised patient, acute kidney injury.   Assessment & Plan:   Principal Problem:   Shigellosis Active Problems:   Infectious diarrhea   HIV (human immunodeficiency virus infection) (HCC)   Hypokalemia   Acute renal failure (ARF) (HCC)  1 shigellosis Patient presented with recent treatment of Shigella enteritis with ciprofloxacin x1 week however after antibiotics completed patient symptoms returned with significant diarrhea nausea worsening abdominal pain.  C. difficile PCR negative.  GI pathogen panel positive for Shigella.  Patient with history of HIV.  Continue empiric IV Rocephin.  Due to patient's immunocompromised status and possible resistance of Shigella to  ciprofloxacin will consult with ID for further evaluation and management and recommendations.  2.  HIV Stable.  Continue home regimen of Genvoya.  3.  Hypokalemia Secondary to GI losses.  Magnesium level at 1.8.  Keep magnesium greater than 2.  Replete potassium.  4.  Acute renal failure Likely secondary to prerenal azotemia secondary to GI losses.  Creatinine on admission was 1.98.  Urinalysis nitrite negative, leukocytes negative.  100 protein.  6-10 WBCs.  Patient noted to have borderline blood pressure on admission.  Renal function trending down.  Continue hydration with IV fluids.  Follow.  5.  Dehydration IV fluids.    DVT prophylaxis: SCDs. Code Status: Full Family Communication: Updated patient.  No family at bedside. Disposition Plan: Home when clinically improved, improvement with diarrhea and when okay with ID.   Consultants:   Infectious disease: Dr. Ninetta Lights pending 09/26/2018  Procedures:   CT abdomen and pelvis 09/25/2018  Antimicrobials:   IV Rocephin 09/25/2018   Subjective: Patient laying in bed.  Denies any chest pain.  No shortness of breath.  States abdominal pain improving.  Patient still with multiple episodes of diarrhea.  Patient states nausea improving.  No emesis.  Patient noted to have a fever of 102.5 overnight.  Objective: Vitals:   09/25/18 2239 09/25/18 2354 09/26/18 0024 09/26/18 0522  BP: 114/72 100/71 112/79 106/66  Pulse: 93 94 82 68  Resp: 16 18 18 17   Temp: 100.1 F (37.8 C)  98.4 F (36.9 C) 98.1 F (36.7 C)  TempSrc: Oral  Oral Oral  SpO2: 97% 96% 100% 98%  Weight:      Height:        Intake/Output Summary (Last  24 hours) at 09/26/2018 1601 Last data filed at 09/26/2018 1500 Gross per 24 hour  Intake 3363.06 ml  Output -  Net 3363.06 ml   Filed Weights   09/25/18 2017  Weight: 77.1 kg    Examination:  General exam: Appears calm and comfortable  Respiratory system: Clear to auscultation. Respiratory effort  normal. Cardiovascular system: S1 & S2 heard, RRR. No JVD, murmurs, rubs, gallops or clicks. No pedal edema. Gastrointestinal system: Abdomen is nondistended, soft and nontender. No organomegaly or masses felt. Normal bowel sounds heard. Central nervous system: Alert and oriented. No focal neurological deficits. Extremities: Symmetric 5 x 5 power. Skin: No rashes, lesions or ulcers Psychiatry: Judgement and insight appear normal. Mood & affect appropriate.     Data Reviewed: I have personally reviewed following labs and imaging studies  CBC: Recent Labs  Lab 09/25/18 1653 09/26/18 0040  WBC 12.9* 8.3  HGB 14.8 13.4  HCT 45.3 40.6  MCV 83.0 81.9  PLT 254 245   Basic Metabolic Panel: Recent Labs  Lab 09/25/18 1653 09/26/18 0040  NA 138 137  K 3.4* 2.8*  CL 100 105  CO2 24 21*  GLUCOSE 124* 100*  BUN 12 14  CREATININE 1.98* 1.58*  CALCIUM 9.1 8.3*  MG  --  1.8  PHOS  --  3.2   GFR: Estimated Creatinine Clearance: 56.3 mL/min (A) (by C-G formula based on SCr of 1.58 mg/dL (H)). Liver Function Tests: Recent Labs  Lab 09/25/18 1653 09/26/18 0040  AST 29 20  ALT 24 20  ALKPHOS 64 59  BILITOT 1.1 0.8  PROT 7.9 6.9  ALBUMIN 4.2 3.7   Recent Labs  Lab 09/25/18 1653  LIPASE 24   No results for input(s): AMMONIA in the last 168 hours. Coagulation Profile: No results for input(s): INR, PROTIME in the last 168 hours. Cardiac Enzymes: No results for input(s): CKTOTAL, CKMB, CKMBINDEX, TROPONINI in the last 168 hours. BNP (last 3 results) No results for input(s): PROBNP in the last 8760 hours. HbA1C: No results for input(s): HGBA1C in the last 72 hours. CBG: No results for input(s): GLUCAP in the last 168 hours. Lipid Profile: No results for input(s): CHOL, HDL, LDLCALC, TRIG, CHOLHDL, LDLDIRECT in the last 72 hours. Thyroid Function Tests: No results for input(s): TSH, T4TOTAL, FREET4, T3FREE, THYROIDAB in the last 72 hours. Anemia Panel: No results for  input(s): VITAMINB12, FOLATE, FERRITIN, TIBC, IRON, RETICCTPCT in the last 72 hours. Sepsis Labs: No results for input(s): PROCALCITON, LATICACIDVEN in the last 168 hours.  Recent Results (from the past 240 hour(s))  Gastrointestinal Panel by PCR , Stool     Status: Abnormal   Collection Time: 09/25/18  8:53 PM  Result Value Ref Range Status   Campylobacter species NOT DETECTED NOT DETECTED Final   Plesimonas shigelloides NOT DETECTED NOT DETECTED Final   Salmonella species NOT DETECTED NOT DETECTED Final   Yersinia enterocolitica NOT DETECTED NOT DETECTED Final   Vibrio species NOT DETECTED NOT DETECTED Final   Vibrio cholerae NOT DETECTED NOT DETECTED Final   Enteroaggregative E coli (EAEC) NOT DETECTED NOT DETECTED Final   Enteropathogenic E coli (EPEC) NOT DETECTED NOT DETECTED Final   Enterotoxigenic E coli (ETEC) NOT DETECTED NOT DETECTED Final   Shiga like toxin producing E coli (STEC) NOT DETECTED NOT DETECTED Final   Shigella/Enteroinvasive E coli (EIEC) DETECTED (A) NOT DETECTED Final    Comment: RESULT CALLED TO, READ BACK BY AND VERIFIED WITH:  david block at 1044 09/26/18 sdr  Cryptosporidium NOT DETECTED NOT DETECTED Final   Cyclospora cayetanensis NOT DETECTED NOT DETECTED Final   Entamoeba histolytica NOT DETECTED NOT DETECTED Final   Giardia lamblia NOT DETECTED NOT DETECTED Final   Adenovirus F40/41 NOT DETECTED NOT DETECTED Final   Astrovirus NOT DETECTED NOT DETECTED Final   Norovirus GI/GII NOT DETECTED NOT DETECTED Final   Rotavirus A NOT DETECTED NOT DETECTED Final   Sapovirus (I, II, IV, and V) NOT DETECTED NOT DETECTED Final    Comment: Performed at [facility], [address] [place], [place] [address]  C difficile quick scan w PCR reflex     Status: None   Collection Time: 09/25/18  8:53 PM  Result Value Ref Range Status   C Diff antigen NEGATIVE NEGATIVE Final   C Diff toxin NEGATIVE NEGATIVE Final   C Diff interpretation No C. difficile  detected.  Final    Comment: Performed at [facility], [address] [place], [place] [address]  C difficile quick scan w PCR reflex     Status: None   Collection Time: 09/26/18  5:32 AM  Result Value Ref Range Status   C Diff antigen NEGATIVE NEGATIVE Final   C Diff toxin NEGATIVE NEGATIVE Final   C Diff interpretation No C. difficile detected.  Final    Comment: Performed at [facility], [address], [place], [place] [address]         Radiology Studies: Ct Abdomen Pelvis Wo Contrast  Result Date: 09/25/2018 CLINICAL DATA:  Abdominal pain, nausea EXAM: CT ABDOMEN AND PELVIS WITHOUT CONTRAST TECHNIQUE: Multidetector CT imaging of the abdomen and pelvis was performed following the standard protocol without IV contrast. COMPARISON:  10/18/2008 FINDINGS: Lower chest: Small hiatal hernia. Lung bases are clear. No effusions. Heart is normal size. Hepatobiliary: No focal liver abnormality is seen. Status post cholecystectomy. No biliary dilatation. Pancreas: No focal abnormality or ductal dilatation. Spleen: No focal abnormality.  Normal size. Adrenals/Urinary Tract: Low-density area within the midpole of the right kidney shown on prior study to represent a cyst. This measures 1.3 cm. No hydronephrosis. No renal or ureteral stones. Urinary bladder is unremarkable. Stomach/Bowel: Normal appendix. Stomach, large and small bowel grossly unremarkable. Vascular/Lymphatic: No evidence of aneurysm or adenopathy. Reproductive: No visible focal abnormality. Other: No free fluid or free air. Musculoskeletal: No acute bony abnormality. IMPRESSION: Small hiatal hernia. Prior cholecystectomy. No renal or ureteral stones.  No hydronephrosis. Normal appendix. No visible acute findings in the abdomen or pelvis. Electronically Signed   By: Charlett Nose M.D.   On: 09/25/2018 20:17        Scheduled Meds: . dicyclomine  20 mg Oral TID AC  .  elvitegravir-cobicistat-emtricitabine-tenofovir  1 tablet Oral Q breakfast  . famotidine  20 mg Oral BID   Continuous Infusions: . sodium chloride 125 mL/hr at 09/26/18 0830  . cefTRIAXone (ROCEPHIN)  IV 2 g (09/26/18 0831)     LOS: 1 day    Time spent: 40 minutes    Ramiro Harvest, MD [facility] Pager [phone]  If 7PM-7AM, please contact night-coverage [email or web address] Password [email or web address] 09/26/2018, 4:01 PM

## 2018-09-26 NOTE — Consult Note (Addendum)
[facility] for Infectious Disease    Date of Admission:  09/25/2018   Total days of antibiotics: 1 ceftriaxone               Reason for Consult: HIV+, shilgellosis Referring Provider: Thompson   Assessment: HIV+ Shigelosis AKI (Cr 1.98  <-- 1.21)  Plan: 1. Continue ceftriaxone 2. Continue hydration 3. Avoid anti-parestatltics for now 4. Continue genvoya 5. Continue enteric precautiosn   Thank you so much for this interesting consult,  Active Problems:   Infectious diarrhea   . dicyclomine  20 mg Oral TID AC  . elvitegravir-cobicistat-emtricitabine-tenofovir  1 tablet Oral Q breakfast  . famotidine  20 mg Oral BID  . potassium chloride  40 mEq Oral Q4H    HPI: Trevor Collins is a 57 y.o. male with hx of HIV+ since 2008 (followed at [facility], last CD4 623 (08-18-18) HIV RNA < 20, on genvoya- his only ART). He was seen by his PCP recently for abd pain/emesis and had stool test+ for shigella. He was treated with cipro for 7 days (ending 10-15).  He comes to ED on 10-20 with fever 102.5, diarrhea, n/v and abd cramping.    His stool toxin is again + for shigella/EIEC. His C diff is (-) x 2.    9 loose BM today however feels better.  Denies sick exposures, exposure to raw or undercooked food. No travel.   Review of Systems: Review of Systems  Constitutional: Positive for chills and fever.  Gastrointestinal: Positive for diarrhea, nausea and vomiting. Negative for abdominal pain and constipation.  Please see HPI. All other systems reviewed and negative.   Past Medical History:  Diagnosis Date  . Gonorrhea   . HIV positive ([place])     Social History   Tobacco Use  . Smoking status: Current Some Day Smoker  Substance Use Topics  . Alcohol use: No  . Drug use: No    Family History  Problem Relation Age of Onset  . Hypertension Other   . Diabetes Other   . Cancer Other      Medications:  Scheduled: . dicyclomine  20 mg Oral TID AC  .  elvitegravir-cobicistat-emtricitabine-tenofovir  1 tablet Oral Q breakfast  . famotidine  20 mg Oral BID  . potassium chloride  40 mEq Oral Q4H    Abtx:  Anti-infectives (From admission, onward)   Start     Dose/Rate Route Frequency Ordered Stop   09/26/18 0900  cefTRIAXone (ROCEPHIN) 2 g in sodium chloride 0.9 % 100 mL IVPB     2 g 200 mL/hr over 30 Minutes Intravenous Daily 09/26/18 0020     09/26/18 0800  elvitegravir-cobicistat-emtricitabine-tenofovir (GENVOYA) 150-150-200-10 MG tablet 1 tablet     1 tablet Oral Daily with breakfast 09/26/18 0020     09/25/18 2045  cefTRIAXone (ROCEPHIN) 1 g in sodium chloride 0.9 % 100 mL IVPB     1 g 200 mL/hr over 30 Minutes Intravenous  Once 09/25/18 2043 09/25/18 2340        OBJECTIVE: Blood pressure 106/66, pulse 68, temperature 98.1 F (36.7 C), temperature source Oral, resp. rate 17, height 5' 11"  (1.803 m), weight 77.1 kg, SpO2 98 %.  Physical Exam  Constitutional: He is oriented to person, place, and time. He appears well-developed and well-nourished.  HENT:  Mouth/Throat: No oropharyngeal exudate.  Eyes: EOM are normal.  Neck: Normal range of motion. Neck supple.  Cardiovascular: Normal rate, regular rhythm  and normal heart sounds.  Pulmonary/Chest: Effort normal and breath sounds normal.  Abdominal: Soft. Bowel sounds are normal. He exhibits no distension. There is no tenderness.  Musculoskeletal: He exhibits no edema.  Lymphadenopathy:    He has no cervical adenopathy.  Neurological: He is alert and oriented to person, place, and time.  Skin: Skin is warm and dry.    Lab Results Results for orders placed or performed during the hospital encounter of 09/25/18 (from the past 48 hour(s))  Lipase, blood     Status: None   Collection Time: 09/25/18  4:53 PM  Result Value Ref Range   Lipase 24 11 - 51 U/L    Comment: Performed at [facility], [place] [address] [place], [place] [address]  Comprehensive  metabolic panel     Status: Abnormal   Collection Time: 09/25/18  4:53 PM  Result Value Ref Range   Sodium 138 135 - 145 mmol/L   Potassium 3.4 (L) 3.5 - 5.1 mmol/L   Chloride 100 98 - 111 mmol/L   CO2 24 22 - 32 mmol/L   Glucose, Bld 124 (H) 70 - 99 mg/dL   BUN 12 6 - 20 mg/dL   Creatinine, Ser 1.98 (H) 0.61 - 1.24 mg/dL   Calcium 9.1 8.9 - 10.3 mg/dL   Total Protein 7.9 6.5 - 8.1 g/dL   Albumin 4.2 3.5 - 5.0 g/dL   AST 29 15 - 41 U/L   ALT 24 0 - 44 U/L   Alkaline Phosphatase 64 38 - 126 U/L   Total Bilirubin 1.1 0.3 - 1.2 mg/dL   GFR calc non Af Amer 36 (L) >60 mL/min   GFR calc Af Amer 42 (L) >60 mL/min    Comment: (NOTE) The eGFR has been calculated using the CKD EPI equation. This calculation has not been validated in all clinical situations. eGFR's persistently <60 mL/min signify possible Chronic Kidney Disease.    Anion gap 14 5 - 15    Comment: Performed at [facility], [place] [address], [place], [place] [address]  CBC     Status: Abnormal   Collection Time: 09/25/18  4:53 PM  Result Value Ref Range   WBC 12.9 (H) 4.0 - 10.5 K/uL   RBC 5.46 4.22 - 5.81 MIL/uL   Hemoglobin 14.8 13.0 - 17.0 g/dL   HCT 45.3 39.0 - 52.0 %   MCV 83.0 80.0 - 100.0 fL   MCH 27.1 26.0 - 34.0 pg   MCHC 32.7 30.0 - 36.0 g/dL   RDW 13.9 11.5 - 15.5 %   Platelets 254 150 - 400 K/uL   nRBC 0.0 0.0 - 0.2 %    Comment: Performed at [facility], [place] [address] [place], [place] [address]  Urinalysis, Routine w reflex microscopic     Status: Abnormal   Collection Time: 09/25/18  4:53 PM  Result Value Ref Range   Color, Urine AMBER (A) YELLOW    Comment: BIOCHEMICALS MAY BE AFFECTED BY COLOR   APPearance CLOUDY (A) CLEAR   Specific Gravity, Urine 1.026 1.005 - 1.030   pH 5.0 5.0 - 8.0   Glucose, UA NEGATIVE NEGATIVE mg/dL   Hgb urine dipstick NEGATIVE NEGATIVE   Bilirubin Urine SMALL (A) NEGATIVE   Ketones, ur 5 (A) NEGATIVE mg/dL   Protein, ur 100 (A)  NEGATIVE mg/dL   Nitrite NEGATIVE NEGATIVE   Leukocytes, UA NEGATIVE NEGATIVE   RBC / HPF 0-5 0 - 5 RBC/hpf  WBC, UA 6-10 0 - 5 WBC/hpf   Bacteria, UA MANY (A) NONE SEEN   Squamous Epithelial / LPF 0-5 0 - 5   Mucus PRESENT    Hyaline Casts, UA PRESENT    Amorphous Crystal PRESENT     Comment: Performed at [facility], [place] [address], [place], [place] [address]  Gastrointestinal Panel by PCR , Stool     Status: Abnormal   Collection Time: 09/25/18  8:53 PM  Result Value Ref Range   Campylobacter species NOT DETECTED NOT DETECTED   Plesimonas shigelloides NOT DETECTED NOT DETECTED   Salmonella species NOT DETECTED NOT DETECTED   Yersinia enterocolitica NOT DETECTED NOT DETECTED   Vibrio species NOT DETECTED NOT DETECTED   Vibrio cholerae NOT DETECTED NOT DETECTED   Enteroaggregative E coli (EAEC) NOT DETECTED NOT DETECTED   Enteropathogenic E coli (EPEC) NOT DETECTED NOT DETECTED   Enterotoxigenic E coli (ETEC) NOT DETECTED NOT DETECTED   Shiga like toxin producing E coli (STEC) NOT DETECTED NOT DETECTED   Shigella/Enteroinvasive E coli (EIEC) DETECTED (A) NOT DETECTED    Comment: RESULT CALLED TO, READ BACK BY AND VERIFIED WITH:  david block at 1044 09/26/18 sdr    Cryptosporidium NOT DETECTED NOT DETECTED   Cyclospora cayetanensis NOT DETECTED NOT DETECTED   Entamoeba histolytica NOT DETECTED NOT DETECTED   Giardia lamblia NOT DETECTED NOT DETECTED   Adenovirus F40/41 NOT DETECTED NOT DETECTED   Astrovirus NOT DETECTED NOT DETECTED   Norovirus GI/GII NOT DETECTED NOT DETECTED   Rotavirus A NOT DETECTED NOT DETECTED   Sapovirus (I, II, IV, and V) NOT DETECTED NOT DETECTED    Comment: Performed at [facility], [place] [place], [place] [address]  C difficile quick scan w PCR reflex     Status: None   Collection Time: 09/25/18  8:53 PM  Result Value Ref Range   C Diff antigen NEGATIVE NEGATIVE   C Diff toxin NEGATIVE NEGATIVE   C Diff  interpretation No C. difficile detected.     Comment: Performed at [facility], [place] [address], [place], [place] [address]  Magnesium     Status: None   Collection Time: 09/26/18 12:40 AM  Result Value Ref Range   Magnesium 1.8 1.7 - 2.4 mg/dL    Comment: Performed at [facility], [place] [address], [place], [place] [address]  Phosphorus     Status: None   Collection Time: 09/26/18 12:40 AM  Result Value Ref Range   Phosphorus 3.2 2.5 - 4.6 mg/dL    Comment: Performed at [facility], [place] [address], [place], [place] [address]  CBC     Status: None   Collection Time: 09/26/18 12:40 AM  Result Value Ref Range   WBC 8.3 4.0 - 10.5 K/uL   RBC 4.96 4.22 - 5.81 MIL/uL   Hemoglobin 13.4 13.0 - 17.0 g/dL   HCT 40.6 39.0 - 52.0 %   MCV 81.9 80.0 - 100.0 fL   MCH 27.0 26.0 - 34.0 pg   MCHC 33.0 30.0 - 36.0 g/dL   RDW 13.8 11.5 - 15.5 %   Platelets 245 150 - 400 K/uL   nRBC 0.0 0.0 - 0.2 %    Comment: Performed at [facility], [place] [address] [place],  [address]  Comprehensive metabolic panel     Status: Abnormal   Collection Time: 09/26/18 12:40 AM  Result Value Ref Range   Sodium 137  135 - 145 mmol/L   Potassium 2.8 (L) 3.5 - 5.1 mmol/L    Comment: DELTA CHECK NOTED   Chloride 105 98 - 111 mmol/L   CO2 21 (L) 22 - 32 mmol/L   Glucose, Bld 100 (H) 70 - 99 mg/dL   BUN 14 6 - 20 mg/dL   Creatinine, Ser 1.58 (H) 0.61 - 1.24 mg/dL   Calcium 8.3 (L) 8.9 - 10.3 mg/dL   Total Protein 6.9 6.5 - 8.1 g/dL   Albumin 3.7 3.5 - 5.0 g/dL   AST 20 15 - 41 U/L   ALT 20 0 - 44 U/L   Alkaline Phosphatase 59 38 - 126 U/L   Total Bilirubin 0.8 0.3 - 1.2 mg/dL   GFR calc non Af Amer 48 (L) >60 mL/min   GFR calc Af Amer 55 (L) >60 mL/min    Comment: (NOTE) The eGFR has been calculated using the CKD EPI equation. This calculation has not been validated in all clinical situations. eGFR's persistently <60 mL/min signify  possible Chronic Kidney Disease.    Anion gap 11 5 - 15    Comment: Performed at [facility], [place] [address], [place], [place] [address]  C difficile quick scan w PCR reflex     Status: None   Collection Time: 09/26/18  5:32 AM  Result Value Ref Range   C Diff antigen NEGATIVE NEGATIVE   C Diff toxin NEGATIVE NEGATIVE   C Diff interpretation No C. difficile detected.     Comment: Performed at [facility], [place] [address], [place], [place] [address]      Component Value Date/Time   SDES ABSCESS 10/17/2011 1232   SPECREQUEST NONE 10/17/2011 1232   CULT  10/17/2011 1232    ABUNDANT METHICILLIN RESISTANT STAPHYLOCOCCUS AUREUS Note: RIFAMPIN AND GENTAMICIN SHOULD NOT BE USED AS SINGLE DRUGS FOR TREATMENT OF STAPH INFECTIONS. CRITICAL RESULT CALLED TO, READ BACK BY AND VERIFIED WITH: KIM 10/20/11 0836 BY SMITHERSJ   REPTSTATUS 10/20/2011 FINAL 10/17/2011 1232   Ct Abdomen Pelvis Wo Contrast  Result Date: 09/25/2018 CLINICAL DATA:  Abdominal pain, nausea EXAM: CT ABDOMEN AND PELVIS WITHOUT CONTRAST TECHNIQUE: Multidetector CT imaging of the abdomen and pelvis was performed following the standard protocol without IV contrast. COMPARISON:  10/18/2008 FINDINGS: Lower chest: Small hiatal hernia. Lung bases are clear. No effusions. Heart is normal size. Hepatobiliary: No focal liver abnormality is seen. Status post cholecystectomy. No biliary dilatation. Pancreas: No focal abnormality or ductal dilatation. Spleen: No focal abnormality.  Normal size. Adrenals/Urinary Tract: Low-density area within the midpole of the right kidney shown on prior study to represent a cyst. This measures 1.3 cm. No hydronephrosis. No renal or ureteral stones. Urinary bladder is unremarkable. Stomach/Bowel: Normal appendix. Stomach, large and small bowel grossly unremarkable. Vascular/Lymphatic: No evidence of aneurysm or adenopathy. Reproductive: No visible focal abnormality. Other: No  free fluid or free air. Musculoskeletal: No acute bony abnormality. IMPRESSION: Small hiatal hernia. Prior cholecystectomy. No renal or ureteral stones.  No hydronephrosis. Normal appendix. No visible acute findings in the abdomen or pelvis. Electronically Signed   By: Rolm Baptise M.D.   On: 09/25/2018 20:17   Recent Results (from the past 240 hour(s))  Gastrointestinal Panel by PCR , Stool     Status: Abnormal   Collection Time: 09/25/18  8:53 PM  Result Value Ref Range Status   Campylobacter species NOT DETECTED NOT DETECTED Final   Plesimonas shigelloides NOT DETECTED NOT DETECTED Final   Salmonella  species NOT DETECTED NOT DETECTED Final   Yersinia enterocolitica NOT DETECTED NOT DETECTED Final   Vibrio species NOT DETECTED NOT DETECTED Final   Vibrio cholerae NOT DETECTED NOT DETECTED Final   Enteroaggregative E coli (EAEC) NOT DETECTED NOT DETECTED Final   Enteropathogenic E coli (EPEC) NOT DETECTED NOT DETECTED Final   Enterotoxigenic E coli (ETEC) NOT DETECTED NOT DETECTED Final   Shiga like toxin producing E coli (STEC) NOT DETECTED NOT DETECTED Final   Shigella/Enteroinvasive E coli (EIEC) DETECTED (A) NOT DETECTED Final    Comment: RESULT CALLED TO, READ BACK BY AND VERIFIED WITH:  david block at 1044 09/26/18 sdr    Cryptosporidium NOT DETECTED NOT DETECTED Final   Cyclospora cayetanensis NOT DETECTED NOT DETECTED Final   Entamoeba histolytica NOT DETECTED NOT DETECTED Final   Giardia lamblia NOT DETECTED NOT DETECTED Final   Adenovirus F40/41 NOT DETECTED NOT DETECTED Final   Astrovirus NOT DETECTED NOT DETECTED Final   Norovirus GI/GII NOT DETECTED NOT DETECTED Final   Rotavirus A NOT DETECTED NOT DETECTED Final   Sapovirus (I, II, IV, and V) NOT DETECTED NOT DETECTED Final    Comment: Performed at [facility], [place]., [place], [place] [address]  C difficile quick scan w PCR reflex     Status: None   Collection Time: 09/25/18  8:53 PM  Result Value  Ref Range Status   C Diff antigen NEGATIVE NEGATIVE Final   C Diff toxin NEGATIVE NEGATIVE Final   C Diff interpretation No C. difficile detected.  Final    Comment: Performed at [facility], [place] [address], [place], [place] [address]  C difficile quick scan w PCR reflex     Status: None   Collection Time: 09/26/18  5:32 AM  Result Value Ref Range Status   C Diff antigen NEGATIVE NEGATIVE Final   C Diff toxin NEGATIVE NEGATIVE Final   C Diff interpretation No C. difficile detected.  Final    Comment: Performed at [facility], [place] [address] [place], [place] [address]    Microbiology: Recent Results (from the past 240 hour(s))  Gastrointestinal Panel by PCR , Stool     Status: Abnormal   Collection Time: 09/25/18  8:53 PM  Result Value Ref Range Status   Campylobacter species NOT DETECTED NOT DETECTED Final   Plesimonas shigelloides NOT DETECTED NOT DETECTED Final   Salmonella species NOT DETECTED NOT DETECTED Final   Yersinia enterocolitica NOT DETECTED NOT DETECTED Final   Vibrio species NOT DETECTED NOT DETECTED Final   Vibrio cholerae NOT DETECTED NOT DETECTED Final   Enteroaggregative E coli (EAEC) NOT DETECTED NOT DETECTED Final   Enteropathogenic E coli (EPEC) NOT DETECTED NOT DETECTED Final   Enterotoxigenic E coli (ETEC) NOT DETECTED NOT DETECTED Final   Shiga like toxin producing E coli (STEC) NOT DETECTED NOT DETECTED Final   Shigella/Enteroinvasive E coli (EIEC) DETECTED (A) NOT DETECTED Final    Comment: RESULT CALLED TO, READ BACK BY AND VERIFIED WITH:  david block at 1044 09/26/18 sdr    Cryptosporidium NOT DETECTED NOT DETECTED Final   Cyclospora cayetanensis NOT DETECTED NOT DETECTED Final   Entamoeba histolytica NOT DETECTED NOT DETECTED Final   Giardia lamblia NOT DETECTED NOT DETECTED Final   Adenovirus F40/41 NOT DETECTED NOT DETECTED Final   Astrovirus NOT DETECTED NOT DETECTED Final   Norovirus GI/GII NOT DETECTED  NOT DETECTED Final   Rotavirus A NOT DETECTED NOT DETECTED Final  Sapovirus (I, II, IV, and V) NOT DETECTED NOT DETECTED Final    Comment: Performed at [facility], [place] [place], [place] [address]  C difficile quick scan w PCR reflex     Status: None   Collection Time: 09/25/18  8:53 PM  Result Value Ref Range Status   C Diff antigen NEGATIVE NEGATIVE Final   C Diff toxin NEGATIVE NEGATIVE Final   C Diff interpretation No C. difficile detected.  Final    Comment: Performed at [facility], [place] [address] [place], [place] [address]  C difficile quick scan w PCR reflex     Status: None   Collection Time: 09/26/18  5:32 AM  Result Value Ref Range Status   C Diff antigen NEGATIVE NEGATIVE Final   C Diff toxin NEGATIVE NEGATIVE Final   C Diff interpretation No C. difficile detected.  Final    Comment: Performed at [facility], [place] [address] [place], [place] [address]    Radiographs and labs were personally reviewed by me.   Bobby Rumpf, MD [facility] for Infectious Disease [facility] [phone] 09/26/2018, 1:05 PM

## 2018-09-26 NOTE — Progress Notes (Signed)
CRITICAL VALUE ALERT  Critical Value:  Gi pathogen panel-Shigella/Enteroinvasive E coli   Date & Time Notied:  09/26/18 1045  Provider Notified: Dr. Janee Morn on floor, aware of previous history pta.  Orders Received/Actions taken: Currently on antibiotic for this.

## 2018-09-26 NOTE — Progress Notes (Signed)
Patient arrived on the unit at approximately 0010. He is alert and verbally responsive and voiced no complaints at this time. Enteric precautions in place as per order. Has had 2 loose stools since arrived on the unit. Will continue to encourage fluid intake.

## 2018-09-27 ENCOUNTER — Encounter (HOSPITAL_COMMUNITY): Payer: Self-pay

## 2018-09-27 DIAGNOSIS — A09 Infectious gastroenteritis and colitis, unspecified: Secondary | ICD-10-CM

## 2018-09-27 LAB — BASIC METABOLIC PANEL
Anion gap: 3 — ABNORMAL LOW (ref 5–15)
BUN: 8 mg/dL (ref 6–20)
CALCIUM: 7.7 mg/dL — AB (ref 8.9–10.3)
CO2: 23 mmol/L (ref 22–32)
CREATININE: 0.99 mg/dL (ref 0.61–1.24)
Chloride: 114 mmol/L — ABNORMAL HIGH (ref 98–111)
GFR calc Af Amer: >60 mL/min (ref >60)
Glucose, Bld: 100 mg/dL — ABNORMAL HIGH (ref 70–99)
POTASSIUM: 3.9 mmol/L (ref 3.5–5.1)
SODIUM: 140 mmol/L (ref 135–145)

## 2018-09-27 LAB — CBC WITH DIFFERENTIAL/PLATELET
ABS IMMATURE GRANULOCYTES: 0.02 10*3/uL (ref 0.00–0.07)
BASOS ABS: 0 10*3/uL (ref 0.0–0.1)
Basophils Relative: 1 %
EOS PCT: 2 %
Eosinophils Absolute: 0.1 10*3/uL (ref 0.0–0.5)
HCT: 36.2 % — ABNORMAL LOW (ref 39.0–52.0)
Hemoglobin: 11.9 g/dL — ABNORMAL LOW (ref 13.0–17.0)
Immature Granulocytes: 1 %
LYMPHS PCT: 40 %
Lymphs Abs: 1.7 10*3/uL (ref 0.7–4.0)
MCH: 27.3 pg (ref 26.0–34.0)
MCHC: 32.9 g/dL (ref 30.0–36.0)
MCV: 83 fL (ref 80.0–100.0)
Monocytes Absolute: 0.5 10*3/uL (ref 0.1–1.0)
Monocytes Relative: 13 %
NEUTROS ABS: 1.9 10*3/uL (ref 1.7–7.7)
NEUTROS PCT: 43 %
NRBC: 0 % (ref 0.0–0.2)
PLATELETS: 209 10*3/uL (ref 150–400)
RBC: 4.36 MIL/uL (ref 4.22–5.81)
RDW: 13.7 % (ref 11.5–15.5)
WBC MORPHOLOGY: ABNORMAL
WBC: 4.2 10*3/uL (ref 4.0–10.5)

## 2018-09-27 LAB — MRSA PCR SCREENING: MRSA BY PCR: NEGATIVE

## 2018-09-27 LAB — MAGNESIUM: Magnesium: 2.1 mg/dL (ref 1.7–2.4)

## 2018-09-27 MED ORDER — ALUM & MAG HYDROXIDE-SIMETH 200-200-20 MG/5ML PO SUSP
30.0000 mL | ORAL | Status: DC | PRN
  Administered 2018-09-27: 30 mL via ORAL
  Filled 2018-09-27: qty 30

## 2018-09-27 MED ORDER — PANTOPRAZOLE SODIUM 40 MG PO TBEC
40.0000 mg | DELAYED_RELEASE_TABLET | Freq: Every day | ORAL | Status: DC
  Administered 2018-09-28 – 2018-09-29 (×2): 40 mg via ORAL
  Filled 2018-09-27 (×2): qty 1

## 2018-09-27 NOTE — Progress Notes (Addendum)
Notified [place] -ID prevention and RN can DC enteric precaution now.

## 2018-09-27 NOTE — Progress Notes (Signed)
Per infectious disease MD Hatcher, patient needs to be on enteric precaution. MD. Janee Morn placed enteric precaution order on 09/27/18.

## 2018-09-27 NOTE — Progress Notes (Addendum)
INFECTIOUS DISEASE PROGRESS NOTE  ID: Trevor Collins is a 57 y.o. male with  Principal Problem:   Shigellosis Active Problems:   Infectious diarrhea   HIV (human immunodeficiency virus infection) (HCC)   Hypokalemia   Acute renal failure (ARF) (HCC)  Subjective: Feels better, 4 loose BM this AM.  No further fever  Abtx:  Anti-infectives (From admission, onward)   Start     Dose/Rate Route Frequency Ordered Stop   09/26/18 0900  cefTRIAXone (ROCEPHIN) 2 g in sodium chloride 0.9 % 100 mL IVPB     2 g 200 mL/hr over 30 Minutes Intravenous Daily 09/26/18 0020     09/26/18 0800  elvitegravir-cobicistat-emtricitabine-tenofovir (GENVOYA) 150-150-200-10 MG tablet 1 tablet     1 tablet Oral Daily with breakfast 09/26/18 0020     09/25/18 2045  cefTRIAXone (ROCEPHIN) 1 g in sodium chloride 0.9 % 100 mL IVPB     1 g 200 mL/hr over 30 Minutes Intravenous  Once 09/25/18 2043 09/25/18 2340      Medications:  Scheduled: . dicyclomine  20 mg Oral TID AC  . elvitegravir-cobicistat-emtricitabine-tenofovir  1 tablet Oral Q breakfast  . famotidine  20 mg Oral BID    Objective: Vital signs in last 24 hours: Temp:  [98.1 F (36.7 C)-98.4 F (36.9 C)] 98.1 F (36.7 C) (10/22 0557) Pulse Rate:  [58-63] 62 (10/22 0800) Resp:  [16] 16 (10/22 0557) BP: (107-114)/(71-76) 107/71 (10/22 0557) SpO2:  [98 %-99 %] 99 % (10/22 0557)   General appearance: alert, cooperative and no distress Resp: clear to auscultation bilaterally Cardio: regular rate and rhythm GI: normal findings: bowel sounds normal and soft, non-tender  Lab Results Recent Labs    09/26/18 0040 09/26/18 1504 09/27/18 0553  WBC 8.3  --  4.2  HGB 13.4  --  11.9*  HCT 40.6  --  36.2*  NA 137 138 140  K 2.8* 3.6 3.9  CL 105 107 114*  CO2 21* 25 23  BUN 14 12 8   CREATININE 1.58* 1.26* 0.99   Liver Panel Recent Labs    09/25/18 1653 09/26/18 0040  PROT 7.9 6.9  ALBUMIN 4.2 3.7  AST 29 20  ALT 24 20  ALKPHOS  64 59  BILITOT 1.1 0.8   Sedimentation Rate No results for input(s): ESRSEDRATE in the last 72 hours. C-Reactive Protein No results for input(s): CRP in the last 72 hours.  Microbiology: Recent Results (from the past 240 hour(s))  Gastrointestinal Panel by PCR , Stool     Status: Abnormal   Collection Time: 09/25/18  8:53 PM  Result Value Ref Range Status   Campylobacter species NOT DETECTED NOT DETECTED Final   Plesimonas shigelloides NOT DETECTED NOT DETECTED Final   Salmonella species NOT DETECTED NOT DETECTED Final   Yersinia enterocolitica NOT DETECTED NOT DETECTED Final   Vibrio species NOT DETECTED NOT DETECTED Final   Vibrio cholerae NOT DETECTED NOT DETECTED Final   Enteroaggregative E coli (EAEC) NOT DETECTED NOT DETECTED Final   Enteropathogenic E coli (EPEC) NOT DETECTED NOT DETECTED Final   Enterotoxigenic E coli (ETEC) NOT DETECTED NOT DETECTED Final   Shiga like toxin producing E coli (STEC) NOT DETECTED NOT DETECTED Final   Shigella/Enteroinvasive E coli (EIEC) DETECTED (A) NOT DETECTED Final    Comment: RESULT CALLED TO, READ BACK BY AND VERIFIED WITH:  david block at 1044 09/26/18 sdr    Cryptosporidium NOT DETECTED NOT DETECTED Final   Cyclospora cayetanensis NOT DETECTED NOT DETECTED  Final   Entamoeba histolytica NOT DETECTED NOT DETECTED Final   Giardia lamblia NOT DETECTED NOT DETECTED Final   Adenovirus F40/41 NOT DETECTED NOT DETECTED Final   Astrovirus NOT DETECTED NOT DETECTED Final   Norovirus GI/GII NOT DETECTED NOT DETECTED Final   Rotavirus A NOT DETECTED NOT DETECTED Final   Sapovirus (I, II, IV, and V) NOT DETECTED NOT DETECTED Final    Comment: Performed at [facility], [address] [place], [place] [address]  C difficile quick scan w PCR reflex     Status: None   Collection Time: 09/25/18  8:53 PM  Result Value Ref Range Status   C Diff antigen NEGATIVE NEGATIVE Final   C Diff toxin NEGATIVE NEGATIVE Final   C Diff  interpretation No C. difficile detected.  Final    Comment: Performed at [facility], [address] [place], [place] [address]  C difficile quick scan w PCR reflex     Status: None   Collection Time: 09/26/18  5:32 AM  Result Value Ref Range Status   C Diff antigen NEGATIVE NEGATIVE Final   C Diff toxin NEGATIVE NEGATIVE Final   C Diff interpretation No C. difficile detected.  Final    Comment: Performed at [facility], [address], [place], [place] [address]    Studies/Results: Ct Abdomen Pelvis Wo Contrast  Result Date: 09/25/2018 CLINICAL DATA:  Abdominal pain, nausea EXAM: CT ABDOMEN AND PELVIS WITHOUT CONTRAST TECHNIQUE: Multidetector CT imaging of the abdomen and pelvis was performed following the standard protocol without IV contrast. COMPARISON:  10/18/2008 FINDINGS: Lower chest: Small hiatal hernia. Lung bases are clear. No effusions. Heart is normal size. Hepatobiliary: No focal liver abnormality is seen. Status post cholecystectomy. No biliary dilatation. Pancreas: No focal abnormality or ductal dilatation. Spleen: No focal abnormality.  Normal size. Adrenals/Urinary Tract: Low-density area within the midpole of the right kidney shown on prior study to represent a cyst. This measures 1.3 cm. No hydronephrosis. No renal or ureteral stones. Urinary bladder is unremarkable. Stomach/Bowel: Normal appendix. Stomach, large and small bowel grossly unremarkable. Vascular/Lymphatic: No evidence of aneurysm or adenopathy. Reproductive: No visible focal abnormality. Other: No free fluid or free air. Musculoskeletal: No acute bony abnormality. IMPRESSION: Small hiatal hernia. Prior cholecystectomy. No renal or ureteral stones.  No hydronephrosis. Normal appendix. No visible acute findings in the abdomen or pelvis. Electronically Signed   By: Charlett Nose M.D.   On: 09/25/2018 20:17     Assessment/Plan: HIV+ Shigelosis AKI- resolved  Total days of  antibiotics: 2 ceftriaxone  Fever better Needs enteric precautions (gowns, gloves, hand washing with soap and water). D/i IP and alerted staff.  Would aim for 5 days of ceftriaxone. Difficulty will be that he will be clinically better before he completes this ( most likely). If he is better at day 4, he could be d/c and get last dose from PCP.  Available as needed.          Johny Sax MD, FACP Infectious Diseases (pager) [phone] [email or web address] 09/27/2018, 10:37 AM  LOS: 2 days

## 2018-09-27 NOTE — Progress Notes (Signed)
PROGRESS NOTE    Trevor Collins  MVH:[id number] DOB: 12/07/1961 DOA: 09/25/2018 PCP: Sterling Big, MD    Brief Narrative:  Trevor Collins is a 57 y.o. male with a known history of HIV with most recent CD4 count in the 600s presents to the emergency department for evaluation of abdominal pain and vomiting.  Patient was recently seen and treated with Cipro twice daily for 7 days for Shigella positive stool.  At the conclusion of his course of antibiotics on October 15 his symptoms returned.  He presents to the emergency department today with fever to 103, multiple episodes of loose stools, nonbloody nonbilious vomiting and diffuse abdominal cramping.  Patient denies fevers/chills, weakness, dizziness, chest pain, shortness of breath, dysuria/frequency, changes in mental status.    Otherwise there has been no change in status. Patient has been taking medication as prescribed and there has been no recent change in medication or diet.  No recent antibiotics.  There has been no recent illness, hospitalizations, travel or sick contacts.    EMS/ED Course: Patient received Rocephin, Tylenol, Zofran and IV fluids. Medical admission has been requested for further management of sepsis secondary to infectious diarrhea in immunocompromised patient, acute kidney injury.   Assessment & Plan:   Principal Problem:   Shigellosis Active Problems:   Infectious diarrhea   HIV (human immunodeficiency virus infection) (HCC)   Hypokalemia   Acute renal failure (ARF) (HCC)  1 shigellosis Patient presented with recent treatment of Shigella enteritis with ciprofloxacin x1 week however after antibiotics completed patient symptoms returned with significant diarrhea nausea worsening abdominal pain.  C. difficile PCR negative.  GI pathogen panel positive for Shigella.  Patient with history of HIV.  Continue empiric IV Rocephin.  Due to patient's immunocompromised status and possible resistance of Shigella to  ciprofloxacin, ID was consulted.  ID recommends course of IV Rocephin.  ID states patient needs ongoing enteric precautions (gowns, gloves, handwashing with soap and water).  Enteric precautions were discontinued however will resume enteric precautions per ID recommendations.  ID following and I appreciate the input and recommendations.   2.  HIV Stable.  Continue home regimen of Genvoya.  ID following.  3.  Hypokalemia Secondary to GI losses.  Magnesium level at 2.1.  Potassium repleted.  Follow.  4.  Acute renal failure Likely secondary to prerenal azotemia secondary to GI losses.  Creatinine on admission was 1.98.  Urinalysis nitrite negative, leukocytes negative.  100 protein.  6-10 WBCs.  Patient noted to have borderline blood pressure on admission.  Renal function trending down and improving.  Continue hydration through today and likely saline lock IV fluids later on tonight..  5.  Dehydration Continue hydration with IV fluids.    DVT prophylaxis: SCDs. Code Status: Full Family Communication: Updated patient.  No family at bedside. Disposition Plan: Home when clinically improved, improvement with diarrhea and when okay with ID after full course of IV Rocephin.   Consultants:   Infectious disease: Dr. Ninetta Lights 09/26/2018  Procedures:   CT abdomen and pelvis 09/25/2018  Antimicrobials:   IV Rocephin 09/25/2018   Subjective: Patient in bed.  No chest pain.  No shortness of breath.  Abdominal pain improving.  Patient states had 4 loose stools this morning.  Fever curve trending down.  Objective: Vitals:   09/26/18 0522 09/26/18 2054 09/27/18 0557 09/27/18 0800  BP: 106/66 114/76 107/71   Pulse: 68 63 (!) 58 62  Resp: 17 16 16    Temp: 98.1 F (36.7 C)  98.4 F (36.9 C) 98.1 F (36.7 C)   TempSrc: Oral     SpO2: 98% 98% 99%   Weight:      Height:        Intake/Output Summary (Last 24 hours) at 09/27/2018 1236 Last data filed at 09/27/2018 0901 Gross per 24 hour    Intake 4607.51 ml  Output -  Net 4607.51 ml   Filed Weights   09/25/18 2017  Weight: 77.1 kg    Examination:  General exam: NAD Respiratory system: Lungs clear to auscultation bilaterally, no wheezes, no crackles, no rhonchi.   Cardiovascular system: Regular rate and rhythm no murmurs rubs or gallops.  No JVD.  No lower extremity edema.  Gastrointestinal system: Abdomen is soft, nontender, nondistended, positive bowel sounds.  No rebound.  No guarding.  Central nervous system: Alert and oriented. No focal neurological deficits. Extremities: Symmetric 5 x 5 power. Skin: No rashes, lesions or ulcers Psychiatry: Judgement and insight appear normal. Mood & affect appropriate.     Data Reviewed: I have personally reviewed following labs and imaging studies  CBC: Recent Labs  Lab 09/25/18 1653 09/26/18 0040 09/27/18 0553  WBC 12.9* 8.3 4.2  NEUTROABS  --   --  1.9  HGB 14.8 13.4 11.9*  HCT 45.3 40.6 36.2*  MCV 83.0 81.9 83.0  PLT 254 245 209   Basic Metabolic Panel: Recent Labs  Lab 09/25/18 1653 09/26/18 0040 09/26/18 1504 09/27/18 0553  NA 138 137 138 140  K 3.4* 2.8* 3.6 3.9  CL 100 105 107 114*  CO2 24 21* 25 23  GLUCOSE 124* 100* 111* 100*  BUN 12 14 12 8   CREATININE 1.98* 1.58* 1.26* 0.99  CALCIUM 9.1 8.3* 8.0* 7.7*  MG  --  1.8  --  2.1  PHOS  --  3.2  --   --    GFR: Estimated Creatinine Clearance: 89.8 mL/min (by C-G formula based on SCr of 0.99 mg/dL). Liver Function Tests: Recent Labs  Lab 09/25/18 1653 09/26/18 0040  AST 29 20  ALT 24 20  ALKPHOS 64 59  BILITOT 1.1 0.8  PROT 7.9 6.9  ALBUMIN 4.2 3.7   Recent Labs  Lab 09/25/18 1653  LIPASE 24   No results for input(s): AMMONIA in the last 168 hours. Coagulation Profile: No results for input(s): INR, PROTIME in the last 168 hours. Cardiac Enzymes: No results for input(s): CKTOTAL, CKMB, CKMBINDEX, TROPONINI in the last 168 hours. BNP (last 3 results) No results for input(s):  PROBNP in the last 8760 hours. HbA1C: No results for input(s): HGBA1C in the last 72 hours. CBG: No results for input(s): GLUCAP in the last 168 hours. Lipid Profile: No results for input(s): CHOL, HDL, LDLCALC, TRIG, CHOLHDL, LDLDIRECT in the last 72 hours. Thyroid Function Tests: No results for input(s): TSH, T4TOTAL, FREET4, T3FREE, THYROIDAB in the last 72 hours. Anemia Panel: No results for input(s): VITAMINB12, FOLATE, FERRITIN, TIBC, IRON, RETICCTPCT in the last 72 hours. Sepsis Labs: No results for input(s): PROCALCITON, LATICACIDVEN in the last 168 hours.  Recent Results (from the past 240 hour(s))  Gastrointestinal Panel by PCR , Stool     Status: Abnormal   Collection Time: 09/25/18  8:53 PM  Result Value Ref Range Status   Campylobacter species NOT DETECTED NOT DETECTED Final   Plesimonas shigelloides NOT DETECTED NOT DETECTED Final   Salmonella species NOT DETECTED NOT DETECTED Final   Yersinia enterocolitica NOT DETECTED NOT DETECTED Final   Vibrio species NOT DETECTED  NOT DETECTED Final   Vibrio cholerae NOT DETECTED NOT DETECTED Final   Enteroaggregative E coli (EAEC) NOT DETECTED NOT DETECTED Final   Enteropathogenic E coli (EPEC) NOT DETECTED NOT DETECTED Final   Enterotoxigenic E coli (ETEC) NOT DETECTED NOT DETECTED Final   Shiga like toxin producing E coli (STEC) NOT DETECTED NOT DETECTED Final   Shigella/Enteroinvasive E coli (EIEC) DETECTED (A) NOT DETECTED Final    Comment: RESULT CALLED TO, READ BACK BY AND VERIFIED WITH:  david block at 1044 09/26/18 sdr    Cryptosporidium NOT DETECTED NOT DETECTED Final   Cyclospora cayetanensis NOT DETECTED NOT DETECTED Final   Entamoeba histolytica NOT DETECTED NOT DETECTED Final   Giardia lamblia NOT DETECTED NOT DETECTED Final   Adenovirus F40/41 NOT DETECTED NOT DETECTED Final   Astrovirus NOT DETECTED NOT DETECTED Final   Norovirus GI/GII NOT DETECTED NOT DETECTED Final   Rotavirus A NOT DETECTED NOT DETECTED  Final   Sapovirus (I, II, IV, and V) NOT DETECTED NOT DETECTED Final    Comment: Performed at [facility], [address] [place], [place] [address]  C difficile quick scan w PCR reflex     Status: None   Collection Time: 09/25/18  8:53 PM  Result Value Ref Range Status   C Diff antigen NEGATIVE NEGATIVE Final   C Diff toxin NEGATIVE NEGATIVE Final   C Diff interpretation No C. difficile detected.  Final    Comment: Performed at [facility], [address] [place], [place] [address]  C difficile quick scan w PCR reflex     Status: None   Collection Time: 09/26/18  5:32 AM  Result Value Ref Range Status   C Diff antigen NEGATIVE NEGATIVE Final   C Diff toxin NEGATIVE NEGATIVE Final   C Diff interpretation No C. difficile detected.  Final    Comment: Performed at [facility], [address] [place], [place] [address]         Radiology Studies: Ct Abdomen Pelvis Wo Contrast  Result Date: 09/25/2018 CLINICAL DATA:  Abdominal pain, nausea EXAM: CT ABDOMEN AND PELVIS WITHOUT CONTRAST TECHNIQUE: Multidetector CT imaging of the abdomen and pelvis was performed following the standard protocol without IV contrast. COMPARISON:  10/18/2008 FINDINGS: Lower chest: Small hiatal hernia. Lung bases are clear. No effusions. Heart is normal size. Hepatobiliary: No focal liver abnormality is seen. Status post cholecystectomy. No biliary dilatation. Pancreas: No focal abnormality or ductal dilatation. Spleen: No focal abnormality.  Normal size. Adrenals/Urinary Tract: Low-density area within the midpole of the right kidney shown on prior study to represent a cyst. This measures 1.3 cm. No hydronephrosis. No renal or ureteral stones. Urinary bladder is unremarkable. Stomach/Bowel: Normal appendix. Stomach, large and small bowel grossly unremarkable. Vascular/Lymphatic: No evidence of aneurysm or adenopathy. Reproductive: No visible focal abnormality. Other:  No free fluid or free air. Musculoskeletal: No acute bony abnormality. IMPRESSION: Small hiatal hernia. Prior cholecystectomy. No renal or ureteral stones.  No hydronephrosis. Normal appendix. No visible acute findings in the abdomen or pelvis. Electronically Signed   By: Charlett Nose M.D.   On: 09/25/2018 20:17        Scheduled Meds: . dicyclomine  20 mg Oral TID AC  . elvitegravir-cobicistat-emtricitabine-tenofovir  1 tablet Oral Q breakfast  . famotidine  20 mg Oral BID   Continuous Infusions: . sodium chloride 125 mL/hr at 09/27/18 1118  . cefTRIAXone (ROCEPHIN)  IV 2 g (09/27/18 0907)     LOS:  2 days    Time spent: 40 minutes    Ramiro Harvest, MD [facility] Pager [phone]  If 7PM-7AM, please contact night-coverage [email or web address] Password [facility] 09/27/2018, 12:36 PM

## 2018-09-27 NOTE — Progress Notes (Signed)
RN got a phone call from ID on 10/22/19at 0800 to discontinue the enteric precautions due to patient is continent, patient can use bathroom when needed, no pad or diaper used. Notified Diannia Ruder - infectious disease  at 1110 and got a feedback to DC enteric precaution now.Paged infectious disease MD as well.

## 2018-09-28 DIAGNOSIS — E876 Hypokalemia: Secondary | ICD-10-CM

## 2018-09-28 LAB — CBC
HCT: 34.4 % — ABNORMAL LOW (ref 39.0–52.0)
Hemoglobin: 11.4 g/dL — ABNORMAL LOW (ref 13.0–17.0)
MCH: 27 pg (ref 26.0–34.0)
MCHC: 33.1 g/dL (ref 30.0–36.0)
MCV: 81.5 fL (ref 80.0–100.0)
PLATELETS: 238 10*3/uL (ref 150–400)
RBC: 4.22 MIL/uL (ref 4.22–5.81)
RDW: 13.5 % (ref 11.5–15.5)
WBC: 3.9 10*3/uL — ABNORMAL LOW (ref 4.0–10.5)
nRBC: 0 % (ref 0.0–0.2)

## 2018-09-28 LAB — BASIC METABOLIC PANEL
ANION GAP: 8 (ref 5–15)
BUN: 6 mg/dL (ref 6–20)
CALCIUM: 8.1 mg/dL — AB (ref 8.9–10.3)
CO2: 23 mmol/L (ref 22–32)
CREATININE: 0.85 mg/dL (ref 0.61–1.24)
Chloride: 109 mmol/L (ref 98–111)
GFR calc Af Amer: >60 mL/min (ref >60)
GLUCOSE: 94 mg/dL (ref 70–99)
Potassium: 3.4 mmol/L — ABNORMAL LOW (ref 3.5–5.1)
Sodium: 140 mmol/L (ref 135–145)

## 2018-09-28 MED ORDER — POTASSIUM CHLORIDE CRYS ER 20 MEQ PO TBCR
40.0000 meq | EXTENDED_RELEASE_TABLET | Freq: Two times a day (BID) | ORAL | Status: AC
  Administered 2018-09-28 (×2): 40 meq via ORAL
  Filled 2018-09-28 (×2): qty 2

## 2018-09-28 MED ORDER — BOOST / RESOURCE BREEZE PO LIQD CUSTOM
1.0000 | Freq: Two times a day (BID) | ORAL | Status: DC
  Administered 2018-09-28 – 2018-09-29 (×3): 1 via ORAL

## 2018-09-28 NOTE — Progress Notes (Signed)
Initial Nutrition Assessment  INTERVENTION:   Provide Boost Breeze po BID, each supplement provides 250 kcal and 9 grams of protein  NUTRITION DIAGNOSIS:   Increased nutrient needs related to chronic illness as evidenced by estimated needs.  GOAL:   Patient will meet greater than or equal to 90% of their needs  MONITOR:   PO intake, Supplement acceptance, Labs, Weight trends, I & O's  REASON FOR ASSESSMENT:   Malnutrition Screening Tool    ASSESSMENT:    57 y.o. male known history of HIV, with a last CD4 count of 600 presents to the ED for evaluation of abdominal pain and vomiting, have been recently been treated with Cipro for 7 days for Shigella positive, but after completion of his treatment his symptoms return, came into the ED with a fever of 103 with multiple bowel movements  Patient states he is eating well over the past couple of days. States at home he was eating 1 meal/day when he would come home from work. States he plans to continue to eat breakfast when discharged. States it's easy here to eat 3 meals a day when someone else cooks for him.  Pt is interested in [employer] supplements, will order. Pt reports UBW of 172 lb. Denies any weight changes. Very minimal weight loss per chart review. Pt weighed 174 lb on 9/12.   Medications: K-DUR tablet BID Labs reviewed: Low K Mg/Phos WNL   NUTRITION - FOCUSED PHYSICAL EXAM:  Nutrition focused physical exam shows no sign of depletion of muscle mass or body fat.  Diet Order:   Diet Order            Diet regular Room service appropriate? Yes; Fluid consistency: Thin  Diet effective now              EDUCATION NEEDS:   No education needs have been identified at this time  Skin:  Skin Assessment: Reviewed RN Assessment  Last BM:  10/22  Height:   Ht Readings from Last 1 Encounters:  09/25/18 5\' 11"  (1.803 m)    Weight:   Wt Readings from Last 1 Encounters:  09/25/18 77.1 kg    Ideal Body Weight:   78.2 kg  BMI:  Body mass index is 23.71 kg/m.  Estimated Nutritional Needs:   Kcal:  2200-2400  Protein:  110-120g  Fluid:  2.2L/day  Tilda Franco, MS, RD, LDN [facility] Inpatient Clinical Dietitian Pager: [phone] After Hours Pager: [phone]

## 2018-09-28 NOTE — Progress Notes (Addendum)
[facility] PROGRESS NOTE    Progress Note  Trevor Collins  ZOX:[id number] DOB: 1961-10-01 DOA: 09/25/2018 PCP: Sterling Big, MD     Brief Narrative:   Trevor Collins is an 57 y.o. male known history of HIV, with a last CD4 count of 600 presents to the ED for evaluation of abdominal pain and vomiting, have been recently been treated with Cipro for 7 days for Shigella positive, but after completion of his treatment his symptoms return, came into the ED with a fever of 103 with multiple bowel movements  Assessment/Plan:   Failed out patient treatment of shigellosis: Empirically on IV Rocephin, he relates no further watery bowel movements. ID was consulted recommended 5 days of empiric antibiotics currently on 3. His PCP is at [facility], will try to arrange the last days of IV Rocephin that short stay with the case manager.  HIV asymptomatic: Continue current regimen.  Hypokalemia: Likely due to GI losses replete orally.  Acute kidney injury: Likely prerenal in etiology due to GI losses. He was started on IV fluids, his creatinine returned to baseline.   DVT prophylaxis: lovenox Family Communication:none Disposition Plan/Barrier to D/C: home in 2 days Code Status:     Code Status Orders  (From admission, onward)         Start     Ordered   09/26/18 0020  Full code  Continuous     09/26/18 0020        Code Status History    This patient has a current code status but no historical code status.        IV Access:    Peripheral IV   Procedures and diagnostic studies:   No results found.   Medical Consultants:    None.  Anti-Infectives:   IV Rocephin  Subjective:    Trevor Collins no complaints no further bloody bowel movements no watery stools  Objective:    Vitals:   09/27/18 0557 09/27/18 0800 09/27/18 2128 09/28/18 0615  BP: 107/71  125/79 112/78  Pulse: (!) 58 62 (!) 59 64  Resp: 16  20 17   Temp: 98.1 F (36.7 C)   98.3 F (36.8 C) 98.4 F (36.9 C)  TempSrc:   Oral Oral  SpO2: 99%  99% 99%  Weight:      Height:        Intake/Output Summary (Last 24 hours) at 09/28/2018 0854 Last data filed at 09/27/2018 1400 Gross per 24 hour  Intake 981.38 ml  Output -  Net 981.38 ml   Filed Weights   09/25/18 2017  Weight: 77.1 kg    Exam: General exam: In no acute distress. Respiratory system: Good air movement and clear to auscultation. Cardiovascular system: S1 & S2 heard, RRR. Gastrointestinal system: Abdomen is nondistended, soft and nontender.  Central nervous system: Alert and oriented. No focal neurological deficits. Extremities: No pedal edema. Skin: No rashes, lesions or ulcers Psychiatry: Judgement and insight appear normal. Mood & affect appropriate.    Data Reviewed:    Labs: Basic Metabolic Panel: Recent Labs  Lab 09/25/18 1653 09/26/18 0040 09/26/18 1504 09/27/18 0553 09/28/18 0624  NA 138 137 138 140 140  K 3.4* 2.8* 3.6 3.9 3.4*  CL 100 105 107 114* 109  CO2 24 21* 25 23 23   GLUCOSE 124* 100* 111* 100* 94  BUN 12 14 12 8 6   CREATININE 1.98* 1.58* 1.26* 0.99 0.85  CALCIUM 9.1 8.3* 8.0* 7.7* 8.1*  MG  --  1.8  --  2.1  --   PHOS  --  3.2  --   --   --    GFR Estimated Creatinine Clearance: 104.6 mL/min (by C-G formula based on SCr of 0.85 mg/dL). Liver Function Tests: Recent Labs  Lab 09/25/18 1653 09/26/18 0040  AST 29 20  ALT 24 20  ALKPHOS 64 59  BILITOT 1.1 0.8  PROT 7.9 6.9  ALBUMIN 4.2 3.7   Recent Labs  Lab 09/25/18 1653  LIPASE 24   No results for input(s): AMMONIA in the last 168 hours. Coagulation profile No results for input(s): INR, PROTIME in the last 168 hours.  CBC: Recent Labs  Lab 09/25/18 1653 09/26/18 0040 09/27/18 0553 09/28/18 0624  WBC 12.9* 8.3 4.2 3.9*  NEUTROABS  --   --  1.9  --   HGB 14.8 13.4 11.9* 11.4*  HCT 45.3 40.6 36.2* 34.4*  MCV 83.0 81.9 83.0 81.5  PLT 254 245 209 238   Cardiac Enzymes: No results  for input(s): CKTOTAL, CKMB, CKMBINDEX, TROPONINI in the last 168 hours. BNP (last 3 results) No results for input(s): PROBNP in the last 8760 hours. CBG: No results for input(s): GLUCAP in the last 168 hours. D-Dimer: No results for input(s): DDIMER in the last 72 hours. Hgb A1c: No results for input(s): HGBA1C in the last 72 hours. Lipid Profile: No results for input(s): CHOL, HDL, LDLCALC, TRIG, CHOLHDL, LDLDIRECT in the last 72 hours. Thyroid function studies: No results for input(s): TSH, T4TOTAL, T3FREE, THYROIDAB in the last 72 hours.  Invalid input(s): FREET3 Anemia work up: No results for input(s): VITAMINB12, FOLATE, FERRITIN, TIBC, IRON, RETICCTPCT in the last 72 hours. Sepsis Labs: Recent Labs  Lab 09/25/18 1653 09/26/18 0040 09/27/18 0553 09/28/18 0624  WBC 12.9* 8.3 4.2 3.9*   Microbiology Recent Results (from the past 240 hour(s))  Gastrointestinal Panel by PCR , Stool     Status: Abnormal   Collection Time: 09/25/18  8:53 PM  Result Value Ref Range Status   Campylobacter species NOT DETECTED NOT DETECTED Final   Plesimonas shigelloides NOT DETECTED NOT DETECTED Final   Salmonella species NOT DETECTED NOT DETECTED Final   Yersinia enterocolitica NOT DETECTED NOT DETECTED Final   Vibrio species NOT DETECTED NOT DETECTED Final   Vibrio cholerae NOT DETECTED NOT DETECTED Final   Enteroaggregative E coli (EAEC) NOT DETECTED NOT DETECTED Final   Enteropathogenic E coli (EPEC) NOT DETECTED NOT DETECTED Final   Enterotoxigenic E coli (ETEC) NOT DETECTED NOT DETECTED Final   Shiga like toxin producing E coli (STEC) NOT DETECTED NOT DETECTED Final   Shigella/Enteroinvasive E coli (EIEC) DETECTED (A) NOT DETECTED Final    Comment: RESULT CALLED TO, READ BACK BY AND VERIFIED WITH:  david block at 1044 09/26/18 sdr    Cryptosporidium NOT DETECTED NOT DETECTED Final   Cyclospora cayetanensis NOT DETECTED NOT DETECTED Final   Entamoeba histolytica NOT DETECTED NOT  DETECTED Final   Giardia lamblia NOT DETECTED NOT DETECTED Final   Adenovirus F40/41 NOT DETECTED NOT DETECTED Final   Astrovirus NOT DETECTED NOT DETECTED Final   Norovirus GI/GII NOT DETECTED NOT DETECTED Final   Rotavirus A NOT DETECTED NOT DETECTED Final   Sapovirus (I, II, IV, and V) NOT DETECTED NOT DETECTED Final    Comment: Performed at [facility], [address] [place], [place] [address]  C difficile quick scan w PCR reflex     Status: None   Collection Time: 09/25/18  8:53 PM  Result Value Ref Range Status   C Diff antigen NEGATIVE NEGATIVE Final   C Diff toxin NEGATIVE NEGATIVE Final   C Diff interpretation No C. difficile detected.  Final    Comment: Performed at [facility], [address] [place], [place] [address]  Stool culture (children & immunocomp patients)     Status: None (Preliminary result)   Collection Time: 09/25/18 11:59 PM  Result Value Ref Range Status   Salmonella/Shigella Screen PENDING  Incomplete   Campylobacter Culture PENDING  Incomplete   E coli, Shiga toxin Assay Negative Negative Final    Comment: (NOTE) Performed At: [facility] [address] [place], [place] [id number] Jolene Schimke MD YQ:[id number]   C difficile quick scan w PCR reflex     Status: None   Collection Time: 09/26/18  5:32 AM  Result Value Ref Range Status   C Diff antigen NEGATIVE NEGATIVE Final   C Diff toxin NEGATIVE NEGATIVE Final   C Diff interpretation No C. difficile detected.  Final    Comment: Performed at [facility], [address], [place], [place] [address]  MRSA PCR Screening     Status: None   Collection Time: 09/27/18  9:41 AM  Result Value Ref Range Status   MRSA by PCR NEGATIVE NEGATIVE Final    Comment:        The GeneXpert MRSA Assay (FDA approved for NASAL specimens only), is one component of a comprehensive MRSA colonization surveillance program. It is not intended to diagnose  MRSA infection nor to guide or monitor treatment for MRSA infections. Performed at [facility], [address], [place], [place] [address]      Medications:   . dicyclomine  20 mg Oral TID AC  . elvitegravir-cobicistat-emtricitabine-tenofovir  1 tablet Oral Q breakfast  . famotidine  20 mg Oral BID  . pantoprazole  40 mg Oral Q0600   Continuous Infusions: . cefTRIAXone (ROCEPHIN)  IV Stopped (09/27/18 1027)      LOS: 3 days   Marinda Elk  [facility] Pager [phone]  *Please refer to [email or web address], password [email or web address] to get updated schedule on who will round on this patient, as hospitalists switch teams weekly. If 7PM-7AM, please contact night-coverage at [email or web address], password [email or web address] for any overnight needs.  09/28/2018, 8:54 AM

## 2018-09-28 NOTE — Care Management Note (Signed)
Case Management Note  Patient Details  Name: [name] MRN: [id number] Date of Birth: [birth year]  Subjective/Objective:                  nedd for one time dose of rocephone on Friday September 30, 2018   Action/Plan: Will b e done through the sickle cell clininc at 1pm/information added to dc instruction and note given to patient.  Expected Discharge Date:                  Expected Discharge Plan:     In-House Referral:     Discharge planning Services     Post Acute Care Choice:    Choice offered to:     DME Arranged:    DME Agency:     HH Arranged:    HH Agency:     Status of Service:     If discussed at [employer], dates discussed:    Additional Comments:  Golda Acre, RN 09/28/2018, 9:14 AM

## 2018-09-29 NOTE — Discharge Summary (Signed)
Physician Discharge Summary  DEAVIN FORST ZOX:[id number] DOB: 1961/05/01 DOA: 09/25/2018  PCP: Sterling Big, MD  Admit date: 09/25/2018 Discharge date: 09/29/2018  Admitted From: Home Disposition:  Home  Recommendations for Outpatient Follow-up:  1. Follow up with PCP in 1-2 weeks 2. Has a follow-up appointment at the [facility] clinic for a single dose of Rocephin to complete his fifth day.  Home Health:No Equipment/Devices:none  Discharge Condition:stable CODE STATUS:full Diet recommendation: Heart Healthy  Brief/Interim Summary: 57 year old male with known history of HIV with a last CD4 count of 600, recently treated with a seven-day of ciprofloxacin fistula positive he has completed treatment but has not improved, presents to the ED with ongoing abdominal pain  Discharge Diagnoses:  Principal Problem:   Shigellosis Active Problems:   Infectious diarrhea   HIV, asymptomatic (HCC)   Hypokalemia   Acute renal failure (ARF) (HCC) Failed outpatient treatment of shigellosis: Started empirically on IV Rocephin, infectious disease was consulted who recommended a 5-day course, by the third day he was much improved he was discharged on the fourth day and will get his fifth day of Rocephin as an outpatient at the sickle clinic.  HIV currently asymptomatic continue current regimen.  Hypokalemia: Replete orally likely due to GI losses.  Acute kidney injury: Likely prerenal in the setting of excessive GI losses resolved with IV fluid hydration.   Discharge Instructions  Discharge Instructions    Diet - low sodium heart healthy   Complete by:  As directed    Increase activity slowly   Complete by:  As directed      Allergies as of 09/29/2018      Reactions   Morphine And Related Shortness Of Breath      Medication List    TAKE these medications   GENVOYA 150-150-200-10 MG Tabs tablet Generic drug:  elvitegravir-cobicistat-emtricitabine-tenofovir Take 1  tablet by mouth daily with breakfast.   ibuprofen 600 MG tablet Commonly known as:  ADVIL,MOTRIN Take 1 tablet (600 mg total) by mouth every 6 (six) hours as needed.   meloxicam 7.5 MG tablet Commonly known as:  MOBIC Take 2 tablets (15 mg total) by mouth daily.   methocarbamol 500 MG tablet Commonly known as:  ROBAXIN Take 1 tablet (500 mg total) by mouth 2 (two) times daily.   oxyCODONE-acetaminophen 5-325 MG tablet Commonly known as:  PERCOCET/ROXICET Take 1-2 tablets by mouth every 6 (six) hours as needed for moderate pain or severe pain.   ranitidine 150 MG capsule Commonly known as:  ZANTAC Take 150 mg by mouth daily.   SLEEP AID PO Take 1 tablet by mouth at bedtime.      Follow-up Information    [facility]. Go in 2 day(s).   Why:  be at the [facility] center at the above address at 12:45 pm appointment for iv rocephin is at 1:00pm Contact information: [address] [place] [address]         Allergies  Allergen Reactions  . Morphine And Related Shortness Of Breath    Consultations:  ID   Procedures/Studies: Ct Abdomen Pelvis Wo Contrast  Result Date: 09/25/2018 CLINICAL DATA:  Abdominal pain, nausea EXAM: CT ABDOMEN AND PELVIS WITHOUT CONTRAST TECHNIQUE: Multidetector CT imaging of the abdomen and pelvis was performed following the standard protocol without IV contrast. COMPARISON:  10/18/2008 FINDINGS: Lower chest: Small hiatal hernia. Lung bases are clear. No effusions. Heart is normal size. Hepatobiliary: No focal liver abnormality is seen.  Status post cholecystectomy. No biliary dilatation. Pancreas: No focal abnormality or ductal dilatation. Spleen: No focal abnormality.  Normal size. Adrenals/Urinary Tract: Low-density area within the midpole of the right kidney shown on prior study to represent a cyst. This measures 1.3 cm. No hydronephrosis. No renal or ureteral stones. Urinary bladder is unremarkable.  Stomach/Bowel: Normal appendix. Stomach, large and small bowel grossly unremarkable. Vascular/Lymphatic: No evidence of aneurysm or adenopathy. Reproductive: No visible focal abnormality. Other: No free fluid or free air. Musculoskeletal: No acute bony abnormality. IMPRESSION: Small hiatal hernia. Prior cholecystectomy. No renal or ureteral stones.  No hydronephrosis. Normal appendix. No visible acute findings in the abdomen or pelvis. Electronically Signed   By: Charlett Nose M.D.   On: 09/25/2018 20:17     Subjective: No complains  Discharge Exam: Vitals:   09/28/18 2149 09/29/18 0623  BP: 122/84 121/81  Pulse: (!) 56 (!) 56  Resp: 16 16  Temp: 98.9 F (37.2 C) 98.2 F (36.8 C)  SpO2: 99% 95%   Vitals:   09/28/18 0615 09/28/18 1457 09/28/18 2149 09/29/18 0623  BP: 112/78 130/77 122/84 121/81  Pulse: 64 (!) 49 (!) 56 (!) 56  Resp: 17 18 16 16   Temp: 98.4 F (36.9 C) 97.7 F (36.5 C) 98.9 F (37.2 C) 98.2 F (36.8 C)  TempSrc: Oral Oral    SpO2: 99% 98% 99% 95%  Weight:      Height:        General: Pt is alert, awake, not in acute distress Cardiovascular: RRR, S1/S2 +, no rubs, no gallops Respiratory: CTA bilaterally, no wheezing, no rhonchi Abdominal: Soft, NT, ND, bowel sounds + Extremities: no edema, no cyanosis    The results of significant diagnostics from this hospitalization (including imaging, microbiology, ancillary and laboratory) are listed below for reference.     Microbiology: Recent Results (from the past 240 hour(s))  Gastrointestinal Panel by PCR , Stool     Status: Abnormal   Collection Time: 09/25/18  8:53 PM  Result Value Ref Range Status   Campylobacter species NOT DETECTED NOT DETECTED Final   Plesimonas shigelloides NOT DETECTED NOT DETECTED Final   Salmonella species NOT DETECTED NOT DETECTED Final   Yersinia enterocolitica NOT DETECTED NOT DETECTED Final   Vibrio species NOT DETECTED NOT DETECTED Final   Vibrio cholerae NOT DETECTED NOT  DETECTED Final   Enteroaggregative E coli (EAEC) NOT DETECTED NOT DETECTED Final   Enteropathogenic E coli (EPEC) NOT DETECTED NOT DETECTED Final   Enterotoxigenic E coli (ETEC) NOT DETECTED NOT DETECTED Final   Shiga like toxin producing E coli (STEC) NOT DETECTED NOT DETECTED Final   Shigella/Enteroinvasive E coli (EIEC) DETECTED (A) NOT DETECTED Final    Comment: RESULT CALLED TO, READ BACK BY AND VERIFIED WITH:  david block at 1044 09/26/18 sdr    Cryptosporidium NOT DETECTED NOT DETECTED Final   Cyclospora cayetanensis NOT DETECTED NOT DETECTED Final   Entamoeba histolytica NOT DETECTED NOT DETECTED Final   Giardia lamblia NOT DETECTED NOT DETECTED Final   Adenovirus F40/41 NOT DETECTED NOT DETECTED Final   Astrovirus NOT DETECTED NOT DETECTED Final   Norovirus GI/GII NOT DETECTED NOT DETECTED Final   Rotavirus A NOT DETECTED NOT DETECTED Final   Sapovirus (I, II, IV, and V) NOT DETECTED NOT DETECTED Final    Comment: Performed at [facility], [address] [place], [place] [address]  C difficile quick scan w PCR reflex     Status: None   Collection Time:  09/25/18  8:53 PM  Result Value Ref Range Status   C Diff antigen NEGATIVE NEGATIVE Final   C Diff toxin NEGATIVE NEGATIVE Final   C Diff interpretation No C. difficile detected.  Final    Comment: Performed at [facility], [address] [place], [place] [address]  Stool culture (children & immunocomp patients)     Status: None (Preliminary result)   Collection Time: 09/25/18 11:59 PM  Result Value Ref Range Status   Salmonella/Shigella Screen Final report  Final   Campylobacter Culture PENDING  Incomplete   E coli, Shiga toxin Assay Negative Negative Final    Comment: (NOTE) Performed At: [facility] [address] [place], [place] [id number] Jolene Schimke MD XB:[id number]   STOOL CULTURE REFLEX - RSASHR     Status: None   Collection Time: 09/25/18 11:59 PM  Result Value Ref  Range Status   Stool Culture result 1 (RSASHR) Comment  Final    Comment: (NOTE) No Salmonella or Shigella recovered. Performed At: [facility] [address] [place], [place] [id number] Jolene Schimke MD [id number]   C difficile quick scan w PCR reflex     Status: None   Collection Time: 09/26/18  5:32 AM  Result Value Ref Range Status   C Diff antigen NEGATIVE NEGATIVE Final   C Diff toxin NEGATIVE NEGATIVE Final   C Diff interpretation No C. difficile detected.  Final    Comment: Performed at [facility], [address] [place], [place] [address]  MRSA PCR Screening     Status: None   Collection Time: 09/27/18  9:41 AM  Result Value Ref Range Status   MRSA by PCR NEGATIVE NEGATIVE Final    Comment:        The GeneXpert MRSA Assay (FDA approved for NASAL specimens only), is one component of a comprehensive MRSA colonization surveillance program. It is not intended to diagnose MRSA infection nor to guide or monitor treatment for MRSA infections. Performed at [facility], [address] [place], [place] [address]      Labs: BNP (last 3 results) No results for input(s): BNP in the last 8760 hours. Basic Metabolic Panel: Recent Labs  Lab 09/25/18 1653 09/26/18 0040 09/26/18 1504 09/27/18 0553 09/28/18 0624  NA 138 137 138 140 140  K 3.4* 2.8* 3.6 3.9 3.4*  CL 100 105 107 114* 109  CO2 24 21* 25 23 23   GLUCOSE 124* 100* 111* 100* 94  BUN 12 14 12 8 6   CREATININE 1.98* 1.58* 1.26* 0.99 0.85  CALCIUM 9.1 8.3* 8.0* 7.7* 8.1*  MG  --  1.8  --  2.1  --   PHOS  --  3.2  --   --   --    Liver Function Tests: Recent Labs  Lab 09/25/18 1653 09/26/18 0040  AST 29 20  ALT 24 20  ALKPHOS 64 59  BILITOT 1.1 0.8  PROT 7.9 6.9  ALBUMIN 4.2 3.7   Recent Labs  Lab 09/25/18 1653  LIPASE 24   No results for input(s): AMMONIA in the last 168 hours. CBC: Recent Labs  Lab 09/25/18 1653 09/26/18 0040  09/27/18 0553 09/28/18 0624  WBC 12.9* 8.3 4.2 3.9*  NEUTROABS  --   --  1.9  --   HGB 14.8 13.4 11.9* 11.4*  HCT 45.3 40.6 36.2* 34.4*  MCV 83.0 81.9 83.0 81.5  PLT 254 245 209 238   Cardiac Enzymes: No results for input(s):  CKTOTAL, CKMB, CKMBINDEX, TROPONINI in the last 168 hours. BNP: Invalid input(s): POCBNP CBG: No results for input(s): GLUCAP in the last 168 hours. D-Dimer No results for input(s): DDIMER in the last 72 hours. Hgb A1c No results for input(s): HGBA1C in the last 72 hours. Lipid Profile No results for input(s): CHOL, HDL, LDLCALC, TRIG, CHOLHDL, LDLDIRECT in the last 72 hours. Thyroid function studies No results for input(s): TSH, T4TOTAL, T3FREE, THYROIDAB in the last 72 hours.  Invalid input(s): FREET3 Anemia work up No results for input(s): VITAMINB12, FOLATE, FERRITIN, TIBC, IRON, RETICCTPCT in the last 72 hours. Urinalysis    Component Value Date/Time   COLORURINE AMBER (A) 09/25/2018 1653   APPEARANCEUR CLOUDY (A) 09/25/2018 1653   LABSPEC 1.026 09/25/2018 1653   PHURINE 5.0 09/25/2018 1653   GLUCOSEU NEGATIVE 09/25/2018 1653   HGBUR NEGATIVE 09/25/2018 1653   BILIRUBINUR SMALL (A) 09/25/2018 1653   BILIRUBINUR negative 12/04/2012 1446   KETONESUR 5 (A) 09/25/2018 1653   PROTEINUR 100 (A) 09/25/2018 1653   UROBILINOGEN 1.0 12/04/2012 1446   UROBILINOGEN 1.0 08/15/2008 1606   NITRITE NEGATIVE 09/25/2018 1653   LEUKOCYTESUR NEGATIVE 09/25/2018 1653   Sepsis Labs Invalid input(s): PROCALCITONIN,  WBC,  LACTICIDVEN Microbiology Recent Results (from the past 240 hour(s))  Gastrointestinal Panel by PCR , Stool     Status: Abnormal   Collection Time: 09/25/18  8:53 PM  Result Value Ref Range Status   Campylobacter species NOT DETECTED NOT DETECTED Final   Plesimonas shigelloides NOT DETECTED NOT DETECTED Final   Salmonella species NOT DETECTED NOT DETECTED Final   Yersinia enterocolitica NOT DETECTED NOT DETECTED Final   Vibrio species NOT  DETECTED NOT DETECTED Final   Vibrio cholerae NOT DETECTED NOT DETECTED Final   Enteroaggregative E coli (EAEC) NOT DETECTED NOT DETECTED Final   Enteropathogenic E coli (EPEC) NOT DETECTED NOT DETECTED Final   Enterotoxigenic E coli (ETEC) NOT DETECTED NOT DETECTED Final   Shiga like toxin producing E coli (STEC) NOT DETECTED NOT DETECTED Final   Shigella/Enteroinvasive E coli (EIEC) DETECTED (A) NOT DETECTED Final    Comment: RESULT CALLED TO, READ BACK BY AND VERIFIED WITH:  david block at 1044 09/26/18 sdr    Cryptosporidium NOT DETECTED NOT DETECTED Final   Cyclospora cayetanensis NOT DETECTED NOT DETECTED Final   Entamoeba histolytica NOT DETECTED NOT DETECTED Final   Giardia lamblia NOT DETECTED NOT DETECTED Final   Adenovirus F40/41 NOT DETECTED NOT DETECTED Final   Astrovirus NOT DETECTED NOT DETECTED Final   Norovirus GI/GII NOT DETECTED NOT DETECTED Final   Rotavirus A NOT DETECTED NOT DETECTED Final   Sapovirus (I, II, IV, and V) NOT DETECTED NOT DETECTED Final    Comment: Performed at [facility], [address] [place], [place] [address]  C difficile quick scan w PCR reflex     Status: None   Collection Time: 09/25/18  8:53 PM  Result Value Ref Range Status   C Diff antigen NEGATIVE NEGATIVE Final   C Diff toxin NEGATIVE NEGATIVE Final   C Diff interpretation No C. difficile detected.  Final    Comment: Performed at [facility], [address] [place], [place] [address]  Stool culture (children & immunocomp patients)     Status: None (Preliminary result)   Collection Time: 09/25/18 11:59 PM  Result Value Ref Range Status   Salmonella/Shigella Screen Final report  Final   Campylobacter Culture PENDING  Incomplete   E coli, Shiga toxin Assay Negative  Negative Final    Comment: (NOTE) Performed At: [facility] [address] [place], [place] [id number] Jolene Schimke MD KG:[id number]   STOOL CULTURE REFLEX - RSASHR     Status:  None   Collection Time: 09/25/18 11:59 PM  Result Value Ref Range Status   Stool Culture result 1 (RSASHR) Comment  Final    Comment: (NOTE) No Salmonella or Shigella recovered. Performed At: [facility] [address] [place], [place] [id number] Jolene Schimke MD VZ:[id number]   C difficile quick scan w PCR reflex     Status: None   Collection Time: 09/26/18  5:32 AM  Result Value Ref Range Status   C Diff antigen NEGATIVE NEGATIVE Final   C Diff toxin NEGATIVE NEGATIVE Final   C Diff interpretation No C. difficile detected.  Final    Comment: Performed at [facility], [address] [place], [place] [address]  MRSA PCR Screening     Status: None   Collection Time: 09/27/18  9:41 AM  Result Value Ref Range Status   MRSA by PCR NEGATIVE NEGATIVE Final    Comment:        The GeneXpert MRSA Assay (FDA approved for NASAL specimens only), is one component of a comprehensive MRSA colonization surveillance program. It is not intended to diagnose MRSA infection nor to guide or monitor treatment for MRSA infections. Performed at [facility], [address], [place], [place] [address]      Time coordinating discharge: 40 minutes  SIGNED:   Marinda Elk, MD  [facility] 09/29/2018, 8:41 AM Pager   If 7PM-7AM, please contact night-coverage [email or web address] Password [email or web address]

## 2018-09-29 NOTE — Care Management Note (Signed)
Case Management Note  Patient Details  Name: DRAYLON MERCADEL MRN: [id number] Date of Birth: [birth year]  Subjective/Objective:                  discharged to home with self care  Action/Plan: Discharge to home with self care, orders checked for hhc needs. No CM needs present at time of discharge.  Expected Discharge Date:  09/29/18               Expected Discharge Plan:  Home/Self Care  In-House Referral:     Discharge planning Services  CM Consult  Post Acute Care Choice:    Choice offered to:     DME Arranged:    DME Agency:     HH Arranged:    HH Agency:     Status of Service:  Completed, signed off  If discussed at [employer] of Stay Meetings, dates discussed:    Additional Comments:  Golda Acre, RN 09/29/2018, 9:22 AM

## 2018-09-29 NOTE — Progress Notes (Signed)
Patient given discharge instructions, and verbalized an understanding of all discharge instructions.  Patient agrees with discharge plan, and is being discharged in stable medical condition.  Patient to follow up tomorrow for Rocephin at [facility].

## 2018-09-30 ENCOUNTER — Ambulatory Visit (HOSPITAL_COMMUNITY)
Admission: RE | Admit: 2018-09-30 | Discharge: 2018-09-30 | Disposition: A | Discharge status: Home / Self Care | Payer: BLUE CROSS/BLUE SHIELD | Source: Ambulatory Visit | Attending: Internal Medicine | Admitting: Internal Medicine

## 2018-09-30 DIAGNOSIS — E876 Hypokalemia: Secondary | ICD-10-CM | POA: Insufficient documentation

## 2018-09-30 DIAGNOSIS — B20 Human immunodeficiency virus [HIV] disease: Secondary | ICD-10-CM | POA: Diagnosis not present

## 2018-09-30 DIAGNOSIS — N179 Acute kidney failure, unspecified: Secondary | ICD-10-CM | POA: Diagnosis not present

## 2018-09-30 DIAGNOSIS — A039 Shigellosis, unspecified: Secondary | ICD-10-CM | POA: Insufficient documentation

## 2018-09-30 LAB — STOOL CULTURE REFLEX - CMPCXR

## 2018-09-30 LAB — STOOL CULTURE: E COLI SHIGA TOXIN ASSAY: NEGATIVE

## 2018-09-30 LAB — STOOL CULTURE REFLEX - RSASHR

## 2018-09-30 MED ORDER — SODIUM CHLORIDE 0.9 % IV SOLN
2.0000 g | Freq: Once | INTRAVENOUS | Status: AC
  Administered 2018-09-30: 2 g via INTRAVENOUS
  Filled 2018-09-30: qty 2

## 2018-09-30 MED ORDER — SODIUM CHLORIDE 0.9 % IV SOLN
INTRAVENOUS | Status: DC | PRN
  Administered 2018-09-30: 250 mL via INTRAVENOUS

## 2018-09-30 NOTE — Progress Notes (Signed)
Patient received Rocephin via PIV.Tolerated well, vitals stable, discharge instructions given, verbalized understanding. Patient alert, oriented and ambulatory at the time of discharge.

## 2018-09-30 NOTE — Discharge Instructions (Signed)
Ceftriaxone injection What is this medicine? CEFTRIAXONE (sef try AX one) is a cephalosporin antibiotic. It is used to treat certain kinds of bacterial infections. It will not work for colds, flu, or other viral infections. This medicine may be used for other purposes; ask your health care provider or pharmacist if you have questions. COMMON BRAND NAME(S): Rocephin What should I tell my health care provider before I take this medicine? They need to know if you have any of these conditions: -any chronic illness -bowel disease, like colitis -both kidney and liver disease -high bilirubin level in newborn patients -an unusual or allergic reaction to ceftriaxone, other cephalosporin or penicillin antibiotics, foods, dyes, or preservatives -pregnant or trying to get pregnant -breast-feeding How should I use this medicine? This medicine is injected into a muscle or infused it into a vein. It is usually given in a medical office or clinic. If you are to give this medicine you will be taught how to inject it. Follow instructions carefully. Use your doses at regular intervals. Do not take your medicine more often than directed. Do not skip doses or stop your medicine early even if you feel better. Do not stop taking except on your doctor's advice. Talk to your pediatrician regarding the use of this medicine in children. Special care may be needed. Overdosage: If you think you have taken too much of this medicine contact a poison control center or emergency room at once. NOTE: This medicine is only for you. Do not share this medicine with others. What if I miss a dose? If you miss a dose, take it as soon as you can. If it is almost time for your next dose, take only that dose. Do not take double or extra doses. What may interact with this medicine? Do not take this medicine with any of the following medications: -intravenous calcium This medicine may also interact with the following medications: -birth  control pills This list may not describe all possible interactions. Give your health care provider a list of all the medicines, herbs, non-prescription drugs, or dietary supplements you use. Also tell them if you smoke, drink alcohol, or use illegal drugs. Some items may interact with your medicine. What should I watch for while using this medicine? Tell your doctor or health care professional if your symptoms do not improve or if they get worse. Do not treat diarrhea with over the counter products. Contact your doctor if you have diarrhea that lasts more than 2 days or if it is severe and watery. If you are being treated for a sexually transmitted disease, avoid sexual contact until you have finished your treatment. Having sex can infect your sexual partner. Calcium may bind to this medicine and cause lung or kidney problems. Avoid calcium products while taking this medicine and for 48 hours after taking the last dose of this medicine. What side effects may I notice from receiving this medicine? Side effects that you should report to your doctor or health care professional as soon as possible: -allergic reactions like skin rash, itching or hives, swelling of the face, lips, or tongue -breathing problems -fever, chills -irregular heartbeat -pain when passing urine -seizures -stomach pain, cramps -unusual bleeding, bruising -unusually weak or tired Side effects that usually do not require medical attention (report to your doctor or health care professional if they continue or are bothersome): -diarrhea -dizzy, drowsy -headache -nausea, vomiting -pain, swelling, irritation where injected -stomach upset -sweating This list may not describe all possible side effects.   Call your doctor for medical advice about side effects. You may report side effects to FDA at 1-800-FDA-1088. Where should I keep my medicine? Keep out of the reach of children. Store at room temperature below 25 degrees C (77  degrees F). Protect from light. Throw away any unused vials after the expiration date. NOTE: This sheet is a summary. It may not cover all possible information. If you have questions about this medicine, talk to your doctor, pharmacist, or health care provider.  2018 Elsevier/Gold Standard (2014-06-11 09:14:54)  

## 2018-10-14 ENCOUNTER — Emergency Department (HOSPITAL_COMMUNITY)
Admission: EM | Admit: 2018-10-14 | Discharge: 2018-10-15 | Disposition: A | Discharge status: Home / Self Care | Payer: BLUE CROSS/BLUE SHIELD | Attending: Emergency Medicine | Admitting: Emergency Medicine

## 2018-10-14 DIAGNOSIS — B20 Human immunodeficiency virus [HIV] disease: Secondary | ICD-10-CM | POA: Diagnosis not present

## 2018-10-14 DIAGNOSIS — K529 Noninfective gastroenteritis and colitis, unspecified: Secondary | ICD-10-CM | POA: Diagnosis not present

## 2018-10-14 DIAGNOSIS — F17210 Nicotine dependence, cigarettes, uncomplicated: Secondary | ICD-10-CM | POA: Diagnosis not present

## 2018-10-14 DIAGNOSIS — Z79899 Other long term (current) drug therapy: Secondary | ICD-10-CM | POA: Diagnosis not present

## 2018-10-14 DIAGNOSIS — R112 Nausea with vomiting, unspecified: Secondary | ICD-10-CM | POA: Diagnosis not present

## 2018-10-14 DIAGNOSIS — R197 Diarrhea, unspecified: Secondary | ICD-10-CM | POA: Diagnosis present

## 2018-10-14 MED ORDER — ONDANSETRON HCL 4 MG/2ML IJ SOLN
4.0000 mg | Freq: Once | INTRAMUSCULAR | Status: AC
  Administered 2018-10-14: 4 mg via INTRAVENOUS
  Filled 2018-10-14: qty 2

## 2018-10-14 MED ORDER — SODIUM CHLORIDE 0.9 % IV BOLUS
1000.0000 mL | Freq: Once | INTRAVENOUS | Status: AC
  Administered 2018-10-14: 1000 mL via INTRAVENOUS

## 2018-10-14 NOTE — ED Triage Notes (Signed)
Pt states that he has been having generalized body aches , fever and vomiting x 3 days. Pt reports highest fever 103. Pt denies any vomiting today. Pt states that  He took nightquil this am and nothing since then.

## 2018-10-15 LAB — I-STAT CHEM 8, ED
BUN: 4 mg/dL — ABNORMAL LOW (ref 6–20)
Calcium, Ion: 1.14 mmol/L — ABNORMAL LOW (ref 1.15–1.40)
Chloride: 103 mmol/L (ref 98–111)
Creatinine, Ser: 1.1 mg/dL (ref 0.61–1.24)
Glucose, Bld: 96 mg/dL (ref 70–99)
HCT: 41 % (ref 39.0–52.0)
Hemoglobin: 13.9 g/dL (ref 13.0–17.0)
Potassium: 3.8 mmol/L (ref 3.5–5.1)
Sodium: 140 mmol/L (ref 135–145)
TCO2: 28 mmol/L (ref 22–32)

## 2018-10-15 MED ORDER — ONDANSETRON 4 MG PO TBDP: 4.0000 mg | ORAL_TABLET | Freq: Three times a day (TID) | ORAL | 0 refills | Status: AC | PRN

## 2018-10-15 NOTE — ED Provider Notes (Signed)
[facility]-EMERGENCY DEPT Provider Note   CSN: [id number] Arrival date & time: 10/14/18  2023     History   Chief Complaint Chief Complaint  Patient presents with  . Generalized Body Aches    HPI SASAN WILKIE is a 57 y.o. male.  Patient with a history of HIV presents with nausea, vomiting and diarrhea that started 3 days ago. He reports a fever at home earlier today of 102 orally. He did not take anything at home for fever prior to arrival. No hematemesis or bloody stools. 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 He reports his symptoms resolved prior to start of diarrhea 3 days ago. He complains of generalized muscular aches. No cough, congestion or sore throat. No significant abdominal pain although her reports intermittent cramping. No urinary symptoms.   The history is provided by the patient. No language interpreter was used.    Past Medical History:  Diagnosis Date  . Gonorrhea   . HIV (human immunodeficiency virus infection) (HCC) 09/26/2018  . HIV positive [facility])     Patient Active Problem List   Diagnosis Date Noted  . 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 HIV, asymptomatic (HCC) 09/26/2018  . Hypokalemia 09/26/2018  . Acute renal failure (ARF) (HCC) 09/26/2018  . Infectious diarrhea 09/25/2018    Past Surgical History:  Procedure Laterality Date  . CHOLECYSTECTOMY          Home Medications    Prior to Admission medications   Medication Sig Start Date End Date Taking? Authorizing Provider  amitriptyline (ELAVIL) 25 MG tablet Take 25 mg by mouth at bedtime.  09/27/18  Yes [provider]  Cholecalciferol (VITAMIN D3) 25 MCG (1000 UT) CAPS Take 1 capsule by mouth daily.  09/27/18  Yes [provider]  GENVOYA 150-150-200-10 MG TABS tablet Take 1 tablet by mouth daily with breakfast.  09/01/18  Yes [provider]  omeprazole (PRILOSEC) 20  MG capsule Take 20 mg by mouth daily.  09/27/18  Yes [provider]  Pseudoeph-Doxylamine-DM-APAP (NYQUIL PO) Take 30 mLs by mouth every 6 (six) hours as needed (sleep).   Yes [provider]  ibuprofen (ADVIL,MOTRIN) 600 MG tablet Take 1 tablet (600 mg total) by mouth every 6 (six) hours as needed. Patient not taking: Reported on 09/25/2018 11/03/13   Jaynie Crumble, PA-C  meloxicam (MOBIC) 7.5 MG tablet Take 2 tablets (15 mg total) by mouth daily. Patient not taking: Reported on 09/25/2018 10/08/14   Antony Madura, PA-C  methocarbamol (ROBAXIN) 500 MG tablet Take 1 tablet (500 mg total) by mouth 2 (two) times daily. Patient not taking: Reported on 09/25/2018 10/08/14   Antony Madura, PA-C  oxyCODONE-acetaminophen (PERCOCET/ROXICET) 5-325 MG per tablet Take 1-2 tablets by mouth every 6 (six) hours as needed for moderate pain or severe pain. Patient not taking: Reported on 09/25/2018 10/08/14   Antony Madura, PA-C    Family History Family History  Problem Relation Age of Onset  . Hypertension Other   . Diabetes Other   . Cancer Other     Social History Social History   Tobacco Use  . Smoking status: Current Some Day Smoker  . Smokeless tobacco: Current User  Substance Use Topics  . Alcohol use: No  . Drug use: No     Allergies   Morphine and related   Review of Systems Review of Systems  Constitutional: Positive for chills and fever.  HENT: Negative.  Respiratory: Negative.   Cardiovascular: Negative.   Gastrointestinal: Positive for abdominal pain (intermittent cramping), diarrhea, nausea and vomiting. Negative for blood in stool.  Genitourinary: Negative.   Musculoskeletal: Positive for myalgias.  Skin: Negative.  Negative for rash.  Neurological: Positive for weakness (generalized). Negative for syncope and light-headedness.     Physical Exam Updated Vital Signs BP (!) 142/97   Pulse 86   Temp 100.3 F (37.9 C) (Oral)   Resp 18   SpO2 96%    Physical Exam  Constitutional: He is oriented to person, place, and time. He appears well-developed and well-nourished. No distress.  HENT:  Head: Normocephalic.  Mouth/Throat: Mucous membranes are dry.  Neck: Normal range of motion. Neck supple.  Cardiovascular: Normal rate and regular rhythm.  No murmur heard. Pulmonary/Chest: Effort normal and breath sounds normal. He has no wheezes. He has no rales. He exhibits no tenderness.  Abdominal: Soft. Bowel sounds are normal. There is no tenderness. There is no rebound and no guarding.  Musculoskeletal: Normal range of motion.  Neurological: He is alert and oriented to person, place, and time.  Skin: Skin is warm and dry. No rash noted.  Psychiatric: He has a normal mood and affect.  Nursing note and vitals reviewed.    ED Treatments / Results  Labs (all labs ordered are listed, but only abnormal results are displayed) Labs Reviewed  I-STAT CHEM 8, ED - Abnormal; Notable for the following components:      Result Value   BUN 4 (*)    Calcium, Ion 1.14 (*)    All other components within normal limits    EKG None  Radiology No results found.  Procedures Procedures (including critical care time)  Medications Ordered in ED Medications  sodium chloride 0.9 % bolus 1,000 mL (1,000 mLs Intravenous New Bag/Given 10/14/18 2351)  ondansetron (ZOFRAN) injection 4 mg (4 mg Intravenous Given 10/14/18 2352)     Initial Impression / Assessment and Plan / ED Course  I have reviewed the triage vital signs and the nursing notes.  Pertinent labs & imaging results that were available during my care of the patient were reviewed by me and considered in my medical decision making (see chart for details).     Patient to ED with body aches, vomiting and diarrhea. No fever. He has abdominal cramping without significant pain.  He is very well appearing here. No vomiting in ED. No episodes of diarrhea. IVF's provided with Zofran, and patient  reports he is feeling much better. PO challenge offered.  Patient drinking ginger ale without vomiting. He is comfortable with and felt appropriate for discharge home. Encouraged close PCP follow up for recheck.   Final Clinical Impressions(s) / ED Diagnoses   Final diagnoses:  None   1. Gastroenteritis  ED Discharge Orders    None       Elpidio Anis, PA-C 10/15/18 0206    Virgina Norfolk, DO 10/16/18 2020

## 2019-12-15 ENCOUNTER — Other Ambulatory Visit: Payer: Self-pay

## 2019-12-15 ENCOUNTER — Encounter (HOSPITAL_COMMUNITY): Payer: Self-pay | Admitting: Emergency Medicine

## 2019-12-15 DIAGNOSIS — R197 Diarrhea, unspecified: Secondary | ICD-10-CM | POA: Insufficient documentation

## 2019-12-15 DIAGNOSIS — Z21 Asymptomatic human immunodeficiency virus [HIV] infection status: Secondary | ICD-10-CM | POA: Insufficient documentation

## 2019-12-15 DIAGNOSIS — R1084 Generalized abdominal pain: Secondary | ICD-10-CM | POA: Insufficient documentation

## 2019-12-15 DIAGNOSIS — Z720 Tobacco use: Secondary | ICD-10-CM | POA: Insufficient documentation

## 2019-12-15 DIAGNOSIS — R112 Nausea with vomiting, unspecified: Secondary | ICD-10-CM | POA: Diagnosis not present

## 2019-12-15 DIAGNOSIS — Z79899 Other long term (current) drug therapy: Secondary | ICD-10-CM | POA: Diagnosis not present

## 2019-12-15 NOTE — ED Notes (Signed)
Called pt for blood draw. There was no response but someone said he might be in the bathroom.

## 2019-12-15 NOTE — ED Triage Notes (Signed)
Pt reports abd pain with N/V/D x 2 days OTC meds ineffective

## 2019-12-16 ENCOUNTER — Emergency Department (HOSPITAL_COMMUNITY)
Admission: EM | Admit: 2019-12-16 | Discharge: 2019-12-16 | Disposition: A | Discharge status: Home / Self Care | Payer: BC Managed Care – PPO | Attending: Emergency Medicine | Admitting: Emergency Medicine

## 2019-12-16 ENCOUNTER — Encounter (HOSPITAL_COMMUNITY): Payer: Self-pay | Admitting: Emergency Medicine

## 2019-12-16 ENCOUNTER — Emergency Department (HOSPITAL_COMMUNITY): Payer: BC Managed Care – PPO

## 2019-12-16 DIAGNOSIS — R112 Nausea with vomiting, unspecified: Secondary | ICD-10-CM

## 2019-12-16 DIAGNOSIS — R197 Diarrhea, unspecified: Secondary | ICD-10-CM

## 2019-12-16 LAB — CBC WITH DIFFERENTIAL/PLATELET
Abs Immature Granulocytes: 0.01 10*3/uL (ref 0.00–0.07)
Basophils Absolute: 0 10*3/uL (ref 0.0–0.1)
Basophils Relative: 1 %
Eosinophils Absolute: 0.1 10*3/uL (ref 0.0–0.5)
Eosinophils Relative: 2 %
HCT: 51.4 % (ref 39.0–52.0)
Hemoglobin: 17.5 g/dL — ABNORMAL HIGH (ref 13.0–17.0)
Immature Granulocytes: 0 %
Lymphocytes Relative: 28 %
Lymphs Abs: 1.8 10*3/uL (ref 0.7–4.0)
MCH: 28 pg (ref 26.0–34.0)
MCHC: 34 g/dL (ref 30.0–36.0)
MCV: 82.1 fL (ref 80.0–100.0)
Monocytes Absolute: 0.7 10*3/uL (ref 0.1–1.0)
Monocytes Relative: 11 %
Neutro Abs: 3.7 10*3/uL (ref 1.7–7.7)
Neutrophils Relative %: 58 %
Platelets: 258 10*3/uL (ref 150–400)
RBC: 6.26 MIL/uL — ABNORMAL HIGH (ref 4.22–5.81)
RDW: 14.4 % (ref 11.5–15.5)
WBC: 6.4 10*3/uL (ref 4.0–10.5)
nRBC: 0 % (ref 0.0–0.2)

## 2019-12-16 LAB — URINALYSIS, ROUTINE W REFLEX MICROSCOPIC
Bacteria, UA: NONE SEEN
Bilirubin Urine: NEGATIVE
Glucose, UA: NEGATIVE mg/dL
Ketones, ur: 5 mg/dL — AB
Leukocytes,Ua: NEGATIVE
Nitrite: NEGATIVE
Protein, ur: 100 mg/dL — AB
Specific Gravity, Urine: 1.019 (ref 1.005–1.030)
pH: 5 (ref 5.0–8.0)

## 2019-12-16 LAB — COMPREHENSIVE METABOLIC PANEL
ALT: 18 U/L (ref 0–44)
AST: 16 U/L (ref 15–41)
Albumin: 5.1 g/dL — ABNORMAL HIGH (ref 3.5–5.0)
Alkaline Phosphatase: 105 U/L (ref 38–126)
Anion gap: 10 (ref 5–15)
BUN: 15 mg/dL (ref 6–20)
CO2: 20 mmol/L — ABNORMAL LOW (ref 22–32)
Calcium: 9.3 mg/dL (ref 8.9–10.3)
Chloride: 109 mmol/L (ref 98–111)
Creatinine, Ser: 1.59 mg/dL — ABNORMAL HIGH (ref 0.61–1.24)
GFR calc Af Amer: 55 mL/min — ABNORMAL LOW (ref >60)
GFR calc non Af Amer: 48 mL/min — ABNORMAL LOW (ref >60)
Glucose, Bld: 105 mg/dL — ABNORMAL HIGH (ref 70–99)
Potassium: 3.7 mmol/L (ref 3.5–5.1)
Sodium: 139 mmol/L (ref 135–145)
Total Bilirubin: 0.9 mg/dL (ref 0.3–1.2)
Total Protein: 8.8 g/dL — ABNORMAL HIGH (ref 6.5–8.1)

## 2019-12-16 LAB — LIPASE, BLOOD: Lipase: 20 U/L (ref 11–51)

## 2019-12-16 MED ORDER — ONDANSETRON 8 MG PO TBDP: ORAL_TABLET | ORAL | 0 refills | Status: AC

## 2019-12-16 MED ORDER — ONDANSETRON HCL 4 MG/2ML IJ SOLN
4.0000 mg | Freq: Once | INTRAMUSCULAR | Status: AC
  Administered 2019-12-16: 4 mg via INTRAVENOUS
  Filled 2019-12-16: qty 2

## 2019-12-16 MED ORDER — DICYCLOMINE HCL 20 MG PO TABS: 20.0000 mg | ORAL_TABLET | Freq: Two times a day (BID) | ORAL | 0 refills | Status: AC

## 2019-12-16 MED ORDER — DICYCLOMINE HCL 10 MG/ML IM SOLN
20.0000 mg | Freq: Once | INTRAMUSCULAR | Status: AC
  Administered 2019-12-16: 20 mg via INTRAMUSCULAR
  Filled 2019-12-16: qty 2

## 2019-12-16 MED ORDER — SODIUM CHLORIDE (PF) 0.9 % IJ SOLN
INTRAMUSCULAR | Status: AC
  Filled 2019-12-16: qty 50

## 2019-12-16 MED ORDER — ALUM & MAG HYDROXIDE-SIMETH 200-200-20 MG/5ML PO SUSP
30.0000 mL | Freq: Once | ORAL | Status: AC
  Administered 2019-12-16: 30 mL via ORAL
  Filled 2019-12-16: qty 30

## 2019-12-16 MED ORDER — IOHEXOL 300 MG/ML  SOLN
100.0000 mL | Freq: Once | INTRAMUSCULAR | Status: AC | PRN
  Administered 2019-12-16: 100 mL via INTRAVENOUS

## 2019-12-16 NOTE — ED Provider Notes (Signed)
[facility]-EMERGENCY DEPT Provider Note   CSN: [id number] Arrival date & time: 12/15/19  2259     History Chief Complaint  Patient presents with  . Abdominal Pain    Trevor Collins is a 59 y.o. male.  The history is provided by the patient.  Abdominal Pain Pain location:  Generalized Pain quality: cramping   Pain severity:  Moderate Onset quality:  Gradual Timing:  Constant Progression:  Unchanged Chronicity:  New Context: eating   Context: not diet changes, not retching, not sick contacts and not suspicious food intake   Context comment:  No antibiotics  Relieved by:  Nothing Worsened by:  Nothing Ineffective treatments:  None tried Associated symptoms: diarrhea, nausea and vomiting   Associated symptoms: no chest pain, no cough, no fever and no shortness of breath   Diarrhea:    Quality:  Watery   Number of occurrences:  5   Severity:  Moderate   Timing:  Sporadic   Progression:  Unchanged Risk factors: no alcohol abuse   Patient with HIV who has had 3 episodes of emesis daily since Monday and 3-5 episodes of diarrhea related to same.  No covid contacts nor other covid symptoms.  No f/c/r. No antibiotics use.  No travel.       Past Medical History:  Diagnosis Date  . Gonorrhea   . HIV (human immunodeficiency virus infection) (HCC) 09/26/2018  . HIV positive [facility])     Patient Active Problem List   Diagnosis Date Noted  . 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 HIV, asymptomatic (HCC) 09/26/2018  . Hypokalemia 09/26/2018  . Acute renal failure (ARF) (HCC) 09/26/2018  . Infectious diarrhea 09/25/2018    Past Surgical History:  Procedure Laterality Date  . CHOLECYSTECTOMY         Family History  Problem Relation Age of Onset  . Hypertension Other   . Diabetes Other   . Cancer Other     Social History   Tobacco Use  . Smoking status: Current Some Day Smoker  . Smokeless tobacco: Current User  Substance Use Topics  . Alcohol use: No   . Drug use: No    Home Medications Prior to Admission medications   Medication Sig Start Date End Date Taking? Authorizing Provider  amitriptyline (ELAVIL) 25 MG tablet Take 25 mg by mouth at bedtime.  09/27/18   [provider]  Cholecalciferol (VITAMIN D3) 25 MCG (1000 UT) CAPS Take 1 capsule by mouth daily.  09/27/18   [provider]  GENVOYA 150-150-200-10 MG TABS tablet Take 1 tablet by mouth daily with breakfast.  09/01/18   [provider]  ibuprofen (ADVIL,MOTRIN) 600 MG tablet Take 1 tablet (600 mg total) by mouth every 6 (six) hours as needed. Patient not taking: Reported on 09/25/2018 11/03/13   Jaynie Crumble, PA-C  meloxicam (MOBIC) 7.5 MG tablet Take 2 tablets (15 mg total) by mouth daily. Patient not taking: Reported on 09/25/2018 10/08/14   Antony Madura, PA-C  methocarbamol (ROBAXIN) 500 MG tablet Take 1 tablet (500 mg total) by mouth 2 (two) times daily. Patient not taking: Reported on 09/25/2018 10/08/14   Antony Madura, PA-C  omeprazole (PRILOSEC) 20 MG capsule Take 20 mg by mouth daily.  09/27/18   [provider]  ondansetron (ZOFRAN ODT) 4 MG disintegrating tablet Take 1 tablet (4 mg total) by mouth every 8 (eight) hours as needed for nausea or vomiting. 10/15/18   Elpidio Anis, PA-C  oxyCODONE-acetaminophen (PERCOCET/ROXICET) 5-325 MG per tablet  Take 1-2 tablets by mouth every 6 (six) hours as needed for moderate pain or severe pain. Patient not taking: Reported on 09/25/2018 10/08/14   Antony Madura, PA-C  Pseudoeph-Doxylamine-DM-APAP (NYQUIL PO) Take 30 mLs by mouth every 6 (six) hours as needed (sleep).    [provider]    Allergies    Morphine and related  Review of Systems   Review of Systems  Constitutional: Negative for appetite change and fever.  Eyes: Negative for visual disturbance.  Respiratory: Negative for cough and shortness of breath.   Cardiovascular: Negative for chest pain.  Gastrointestinal:  Positive for abdominal pain, diarrhea, nausea and vomiting.  Genitourinary: Negative for difficulty urinating.  Musculoskeletal: Negative for arthralgias.  Neurological: Negative for dizziness.  Psychiatric/Behavioral: Negative for agitation.  All other systems reviewed and are negative.   Physical Exam Updated Vital Signs BP (!) 123/109   Pulse 92   Temp 99 F (37.2 C) (Oral)   Resp 16   Ht 5\' 11"  (1.803 m)   Wt 77.1 kg   SpO2 96%   BMI 23.71 kg/m   Physical Exam Vitals and nursing note reviewed.  Constitutional:      Appearance: Normal appearance. He is not ill-appearing or diaphoretic.  HENT:     Head: Normocephalic and atraumatic.     Nose: Nose normal.  Eyes:     Conjunctiva/sclera: Conjunctivae normal.     Pupils: Pupils are equal, round, and reactive to light.  Cardiovascular:     Rate and Rhythm: Normal rate and regular rhythm.     Pulses: Normal pulses.     Heart sounds: Normal heart sounds.  Pulmonary:     Effort: Pulmonary effort is normal.     Breath sounds: Normal breath sounds.  Abdominal:     General: Abdomen is flat. Bowel sounds are normal.     Tenderness: There is no abdominal tenderness. There is no guarding or rebound.  Musculoskeletal:        General: Normal range of motion.     Cervical back: Normal range of motion and neck supple.  Skin:    General: Skin is warm and dry.     Capillary Refill: Capillary refill takes less than 2 seconds.  Neurological:     General: No focal deficit present.     Mental Status: He is alert and oriented to person, place, and time.     Deep Tendon Reflexes: Reflexes normal.  Psychiatric:        Mood and Affect: Mood normal.        Behavior: Behavior normal.     ED Results / Procedures / Treatments   Labs (all labs ordered are listed, but only abnormal results are displayed) Labs Reviewed  CBC WITH DIFFERENTIAL/PLATELET - Abnormal; Notable for the following components:      Result Value   RBC 6.26 (*)     Hemoglobin 17.5 (*)    All other components within normal limits  COMPREHENSIVE METABOLIC PANEL - Abnormal; Notable for the following components:   CO2 20 (*)    Glucose, Bld 105 (*)    Creatinine, Ser 1.59 (*)    Total Protein 8.8 (*)    Albumin 5.1 (*)    GFR calc non Af Amer 48 (*)    GFR calc Af Amer 55 (*)    All other components within normal limits  URINALYSIS, ROUTINE W REFLEX MICROSCOPIC - Abnormal; Notable for the following components:   Color, Urine AMBER (*)  APPearance HAZY (*)    Hgb urine dipstick SMALL (*)    Ketones, ur 5 (*)    Protein, ur 100 (*)    All other components within normal limits  LIPASE, BLOOD    EKG None  Radiology No results found.  Procedures Procedures (including critical care time)  Medications Ordered in ED Medications  iohexol (OMNIPAQUE) 300 MG/ML solution 100 mL (has no administration in time range)  ondansetron (ZOFRAN) injection 4 mg (4 mg Intravenous Given 12/16/19 0411)  dicyclomine (BENTYL) injection 20 mg (20 mg Intramuscular Given 12/16/19 0411)  alum & mag hydroxide-simeth (MAALOX/MYLANTA) 200-200-20 MG/5ML suspension 30 mL (30 mLs Oral Given 12/16/19 0411)    ED Course  I have reviewed the triage vital signs and the nursing notes.  Pertinent labs & imaging results that were available during my care of the patient were reviewed by me and considered in my medical decision making (see chart for details).   Trevor Collins was evaluated in Emergency Department on 12/16/2019 for the symptoms described in the history of present illness. He was evaluated in the context of the global COVID-19 pandemic, which necessitated consideration that the patient might be at risk for infection with the SARS-CoV-2 virus that causes COVID-19. Institutional protocols and algorithms that pertain to the evaluation of patients at risk for COVID-19 are in a state of rapid change based on information released by regulatory bodies including the CDC and  federal and state organizations. These policies and algorithms were followed during the patient's care in the ED.  Final Clinical Impression(s) / ED Diagnoses Return for intractable cough, coughing up blood,fevers >100.4 unrelieved by medication, shortness of breath, intractable vomiting, chest pain, shortness of breath, weakness,numbness, changes in speech, facial asymmetry,abdominal pain, passing out,Inability to tolerate liquids or food, cough, altered mental status or any concerns. No signs of systemic illness or infection. The patient is nontoxic-appearing on exam and vital signs are within normal limits.   I have reviewed the triage vital signs and the nursing notes. Pertinent labs &imaging results that were available during my care of the patient were reviewed by me and considered in my medical decision making (see chart for details).  After history, exam, and medical workup I feel the patient has been appropriately medically screened and is safe for discharge home. Pertinent diagnoses were discussed with the patient. Patient was given return   H. Rivera Colon, Melena Hayes, MD 12/16/19 [id number]

## 2019-12-16 NOTE — ED Notes (Signed)
Pt states he has had vomiting and diarrhea since Monday. Pt is also complaining of headaches since Monday. He states he is unable to keep any foods or liquids down. Pt denies any fever or chills. Pt denies changes in taste or smell.

## 2019-12-16 NOTE — ED Notes (Signed)
Pt transported to CT ?

## 2022-06-15 ENCOUNTER — Emergency Department (HOSPITAL_COMMUNITY)
Admission: EM | Admit: 2022-06-15 | Discharge: 2022-06-15 | Disposition: A | Discharge status: Home / Self Care | Payer: BC Managed Care – PPO | Attending: Emergency Medicine | Admitting: Emergency Medicine

## 2022-06-15 ENCOUNTER — Other Ambulatory Visit: Payer: Self-pay

## 2022-06-15 ENCOUNTER — Emergency Department (HOSPITAL_COMMUNITY): Payer: BC Managed Care – PPO

## 2022-06-15 ENCOUNTER — Encounter (HOSPITAL_COMMUNITY): Payer: Self-pay

## 2022-06-15 DIAGNOSIS — R200 Anesthesia of skin: Secondary | ICD-10-CM | POA: Insufficient documentation

## 2022-06-15 DIAGNOSIS — R001 Bradycardia, unspecified: Secondary | ICD-10-CM | POA: Diagnosis not present

## 2022-06-15 DIAGNOSIS — R42 Dizziness and giddiness: Secondary | ICD-10-CM | POA: Diagnosis not present

## 2022-06-15 DIAGNOSIS — R61 Generalized hyperhidrosis: Secondary | ICD-10-CM | POA: Diagnosis not present

## 2022-06-15 DIAGNOSIS — R531 Weakness: Secondary | ICD-10-CM | POA: Insufficient documentation

## 2022-06-15 DIAGNOSIS — R202 Paresthesia of skin: Secondary | ICD-10-CM | POA: Insufficient documentation

## 2022-06-15 LAB — CBC WITH DIFFERENTIAL/PLATELET
Abs Immature Granulocytes: 0.01 10*3/uL (ref 0.00–0.07)
Basophils Absolute: 0 10*3/uL (ref 0.0–0.1)
Basophils Relative: 1 %
Eosinophils Absolute: 0.1 10*3/uL (ref 0.0–0.5)
Eosinophils Relative: 2 %
HCT: 39.1 % (ref 39.0–52.0)
Hemoglobin: 13.3 g/dL (ref 13.0–17.0)
Immature Granulocytes: 0 %
Lymphocytes Relative: 41 %
Lymphs Abs: 1.7 10*3/uL (ref 0.7–4.0)
MCH: 27.7 pg (ref 26.0–34.0)
MCHC: 34 g/dL (ref 30.0–36.0)
MCV: 81.3 fL (ref 80.0–100.0)
Monocytes Absolute: 0.4 10*3/uL (ref 0.1–1.0)
Monocytes Relative: 10 %
Neutro Abs: 1.9 10*3/uL (ref 1.7–7.7)
Neutrophils Relative %: 46 %
Platelets: 214 10*3/uL (ref 150–400)
RBC: 4.81 MIL/uL (ref 4.22–5.81)
RDW: 13.3 % (ref 11.5–15.5)
WBC: 4.2 10*3/uL (ref 4.0–10.5)
nRBC: 0 % (ref 0.0–0.2)

## 2022-06-15 LAB — TROPONIN I (HIGH SENSITIVITY): Troponin I (High Sensitivity): 5 ng/L (ref <18)

## 2022-06-15 LAB — COMPREHENSIVE METABOLIC PANEL
ALT: 13 U/L (ref 0–44)
AST: 14 U/L — ABNORMAL LOW (ref 15–41)
Albumin: 3.7 g/dL (ref 3.5–5.0)
Alkaline Phosphatase: 71 U/L (ref 38–126)
Anion gap: 8 (ref 5–15)
BUN: 8 mg/dL (ref 6–20)
CO2: 27 mmol/L (ref 22–32)
Calcium: 8.8 mg/dL — ABNORMAL LOW (ref 8.9–10.3)
Chloride: 106 mmol/L (ref 98–111)
Creatinine, Ser: 1.19 mg/dL (ref 0.61–1.24)
GFR, Estimated: >60 mL/min (ref >60)
Glucose, Bld: 92 mg/dL (ref 70–99)
Potassium: 3.6 mmol/L (ref 3.5–5.1)
Sodium: 141 mmol/L (ref 135–145)
Total Bilirubin: 0.7 mg/dL (ref 0.3–1.2)
Total Protein: 6.3 g/dL — ABNORMAL LOW (ref 6.5–8.1)

## 2022-06-15 LAB — CBG MONITORING, ED: Glucose-Capillary: 103 mg/dL — ABNORMAL HIGH (ref 70–99)

## 2022-06-15 NOTE — ED Provider Notes (Signed)
[facility] EMERGENCY DEPARTMENT Provider Note   CSN: [id number] Arrival date & time: 06/15/22  1036     History  Chief Complaint  Patient presents with   Weakness   Numbness    Trevor Collins is a 61 y.o. male.  He has a history of HIV in good control.  He was in his usual state of health last evening when he acutely felt dizzy and lightheaded like he might pass out after standing up.  He was diaphoretic.  He also noticed some numbness and tingling in his left face and left arm.  His symptoms improved and he was able to drive home and go to bed.  This morning when he woke up he still felt a little lightheaded and unsteady and still had the tingling in his left face and left arm although better than yesterday.  He has had a history of near syncope before.  He denies any recent illness.  His HIV medication changed from [facility] to an injection medication about a month ago.  He has been eating and drinking well. Denies drugs, alcohol.   The history is provided by the patient and the EMS personnel.  Near Syncope This is a recurrent problem. The current episode started yesterday. The problem has been gradually improving. Pertinent negatives include no chest pain, no abdominal pain, no headaches and no shortness of breath. The symptoms are aggravated by standing and walking. The symptoms are relieved by rest and sleep. He has tried rest for the symptoms. The treatment provided moderate relief.       Home Medications Prior to Admission medications   Medication Sig Start Date End Date Taking? Authorizing Provider  amitriptyline (ELAVIL) 25 MG tablet Take 25 mg by mouth at bedtime.  09/27/18   [provider]  Cholecalciferol (VITAMIN D3) 25 MCG (1000 UT) CAPS Take 1 capsule by mouth daily.  09/27/18   [provider]  dicyclomine (BENTYL) 20 MG tablet Take 1 tablet (20 mg total) by mouth 2 (two) times daily. 12/16/19   Palumbo, April, MD  GENVOYA  150-150-200-10 MG TABS tablet Take 1 tablet by mouth daily with breakfast.  09/01/18   [provider]  ibuprofen (ADVIL,MOTRIN) 600 MG tablet Take 1 tablet (600 mg total) by mouth every 6 (six) hours as needed. Patient not taking: Reported on 09/25/2018 11/03/13   Jaynie Crumble, PA-C  meloxicam (MOBIC) 7.5 MG tablet Take 2 tablets (15 mg total) by mouth daily. Patient not taking: Reported on 09/25/2018 10/08/14   Antony Madura, PA-C  methocarbamol (ROBAXIN) 500 MG tablet Take 1 tablet (500 mg total) by mouth 2 (two) times daily. Patient not taking: Reported on 09/25/2018 10/08/14   Antony Madura, PA-C  omeprazole (PRILOSEC) 20 MG capsule Take 20 mg by mouth daily.  09/27/18   [provider]  ondansetron (ZOFRAN ODT) 4 MG disintegrating tablet Take 1 tablet (4 mg total) by mouth every 8 (eight) hours as needed for nausea or vomiting. 10/15/18   Elpidio Anis, PA-C  ondansetron (ZOFRAN ODT) 8 MG disintegrating tablet 8mg  ODT q8 hours prn nausea 12/16/19   Palumbo, April, MD  oxyCODONE-acetaminophen (PERCOCET/ROXICET) 5-325 MG per tablet Take 1-2 tablets by mouth every 6 (six) hours as needed for moderate pain or severe pain. Patient not taking: Reported on 09/25/2018 10/08/14   13/2/15, PA-C  Pseudoeph-Doxylamine-DM-APAP (NYQUIL PO) Take 30 mLs by mouth every 6 (six) hours as needed (sleep).    [provider]  Allergies    Morphine and related    Review of Systems   Review of Systems  Constitutional:  Positive for diaphoresis. Negative for fever.  HENT:  Negative for sore throat.   Eyes:  Negative for visual disturbance.  Respiratory:  Negative for shortness of breath.   Cardiovascular:  Positive for near-syncope. Negative for chest pain.  Gastrointestinal:  Negative for abdominal pain.  Musculoskeletal:  Negative for neck pain.  Skin:  Negative for rash.  Neurological:  Positive for dizziness, light-headedness and numbness. Negative for syncope, facial  asymmetry, speech difficulty, weakness and headaches.    Physical Exam Updated Vital Signs BP 132/87 (BP Location: Right Arm)   Pulse (!) 49   Temp 98 F (36.7 C) (Oral)   Resp 15   Ht 5\' 11"  (1.803 m)   Wt 77.1 kg   SpO2 98%   BMI 23.71 kg/m  Physical Exam Vitals and nursing note reviewed.  Constitutional:      General: He is not in acute distress.    Appearance: Normal appearance. He is well-developed.  HENT:     Head: Normocephalic and atraumatic.  Eyes:     Conjunctiva/sclera: Conjunctivae normal.  Cardiovascular:     Rate and Rhythm: Regular rhythm. Bradycardia present.     Heart sounds: No murmur heard. Pulmonary:     Effort: Pulmonary effort is normal. No respiratory distress.     Breath sounds: Normal breath sounds.  Abdominal:     Palpations: Abdomen is soft.     Tenderness: There is no abdominal tenderness. There is no guarding or rebound.  Musculoskeletal:        General: No deformity or signs of injury. Normal range of motion.     Cervical back: Neck supple.  Skin:    General: Skin is warm and dry.     Capillary Refill: Capillary refill takes less than 2 seconds.  Neurological:     General: No focal deficit present.     Mental Status: He is alert and oriented to person, place, and time.     Cranial Nerves: No cranial nerve deficit.     Sensory: No sensory deficit.     Motor: No weakness.     Coordination: Coordination normal.     ED Results / Procedures / Treatments   Labs (all labs ordered are listed, but only abnormal results are displayed) Labs Reviewed  COMPREHENSIVE METABOLIC PANEL - Abnormal; Notable for the following components:      Result Value   Calcium 8.8 (*)    Total Protein 6.3 (*)    AST 14 (*)    All other components within normal limits  CBG MONITORING, ED - Abnormal; Notable for the following components:   Glucose-Capillary 103 (*)    All other components within normal limits  CBC WITH DIFFERENTIAL/PLATELET  TROPONIN I (HIGH  SENSITIVITY)    EKG EKG Interpretation  Date/Time:  Monday June 15 2022 10:55:42 EDT Ventricular Rate:  46 PR Interval:  172 QRS Duration: 106 QT Interval:  452 QTC Calculation: 395 R Axis:   44 Text Interpretation: Sinus bradycardia Septal infarct , age undetermined Abnormal ECG When compared with ECG of 26-Jul-2012 21:02, rate is now slower Confirmed by 28-Jul-2012 [phone]) on 06/15/2022 10:57:46 AM  Radiology MR Brain Wo Contrast (neuro protocol)  Result Date: 06/15/2022 CLINICAL DATA:  Neuro deficit, acute, stroke suspected; left face arm numbness EXAM: MRI HEAD WITHOUT CONTRAST TECHNIQUE: Multiplanar, multiecho pulse sequences of the brain and surrounding structures were  obtained without intravenous contrast. COMPARISON:  None Available. FINDINGS: Motion artifact is present. Brain: There is no acute infarction or intracranial hemorrhage. There is no intracranial mass, mass effect, or edema. There is no hydrocephalus or extra-axial fluid collection. Ventricles and sulci are normal in size and configuration. Patchy small foci of T2 hyperintensity in the supratentorial white matter are nonspecific but may reflect mild chronic microvascular ischemic changes. Vascular: Major vessel flow voids at the skull base are preserved. Skull and upper cervical spine: Normal marrow signal is preserved. Sinuses/Orbits: Paranasal sinuses are aerated. Orbits are unremarkable. Other: Sella is unremarkable.  Mastoid air cells are clear. IMPRESSION: No acute infarction, hemorrhage, or mass. Mild burden of nonspecific gliosis/demyelination in the cerebral white matter possibly reflecting microvascular ischemic changes or sequelae of HIV. Electronically Signed   By: Guadlupe Spanish M.D.   On: 06/15/2022 15:13    Procedures Procedures    Medications Ordered in ED Medications - No data to display  ED Course/ Medical Decision Making/ A&P                           Medical Decision Making Amount and/or  Complexity of Data Reviewed Labs: ordered. Radiology: ordered.  This patient complains of near syncope diaphoresis numbness in face and arm on left; this involves an extensive number of treatment Options and is a complaint that carries with it a high risk of complications and morbidity. The differential includes near syncope, arrhythmia, dehydration, hypoglycemia, stroke, bleed  I ordered, reviewed and interpreted labs, which included CBC with normal white count normal hemoglobin, chemistries normal, LFTs unremarkable, troponin unremarkable  I ordered imaging studies which included MRI brain and I independently    visualized and interpreted imaging which showed no acute findings  Previous records obtained and reviewed in epic no recent admissions  Cardiac monitoring reviewed, normal sinus rhythm Social determinants considered, ongoing tobacco use Critical Interventions: None   After the interventions stated above, I reevaluated the patient and found patient be neuro intact Admission and further testing considered, no indications for admission or further testing at this time.  Will refer back to PCP.  Return instructions discussed          Final Clinical Impression(s) / ED Diagnoses Final diagnoses:  Numbness  Dizziness    Rx / DC Orders ED Discharge Orders     None         Terrilee Files, MD 06/15/22 1732

## 2022-06-15 NOTE — ED Notes (Signed)
Reviewed discharge instructions with patient. Follow-up care reviewed. Patient verbalized understanding. Patient A&Ox4, VSS, and ambulatory with steady gait upon discharge.  

## 2022-06-15 NOTE — ED Notes (Signed)
Pt transported to MRI at this time 

## 2022-06-15 NOTE — ED Notes (Signed)
Pt back from MRI 

## 2022-06-15 NOTE — Discharge Instructions (Signed)
You are seen in the emergency department for evaluation of lightheadedness dizziness, left-sided numbness and face and arm.  You had blood work EKG and an MRI of your brain that did not show any obvious explanation for your symptoms.  Please continue your regular medications and follow-up with your regular doctor.  Return to the emergency department if any worsening or concerning symptoms.

## 2022-06-15 NOTE — ED Triage Notes (Signed)
Pt BIB [facility] from work. At 2130 last PM pt had an episode where he became diaphoretic, dizzy, and had L arm numbness, L tongue numbness, and near syncopal event. Pt states sx improved but never fully subsided. Pt went to work today and had residual sx of L arm numbness and weakness, L tongue numbness, and L sided headache. EMS VS 137/85, HR 48 (takes propranolol), CBG 105, Spo2 98%
